# Patient Record
Sex: Male | Born: 1940 | Race: White | Hispanic: No | Marital: Married | Smoking: Former smoker
Health system: Southern US, Community
[De-identification: ages and names within clinical notes are randomized; demographics above are authoritative.]

## PROBLEM LIST (undated history)

## (undated) DIAGNOSIS — N400 Enlarged prostate without lower urinary tract symptoms: Secondary | ICD-10-CM

## (undated) DIAGNOSIS — R519 Headache, unspecified: Secondary | ICD-10-CM

## (undated) DIAGNOSIS — J449 Chronic obstructive pulmonary disease, unspecified: Secondary | ICD-10-CM

## (undated) DIAGNOSIS — K219 Gastro-esophageal reflux disease without esophagitis: Secondary | ICD-10-CM

## (undated) DIAGNOSIS — D126 Benign neoplasm of colon, unspecified: Secondary | ICD-10-CM

## (undated) DIAGNOSIS — K227 Barrett'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 dysplasia: Secondary | ICD-10-CM

## (undated) DIAGNOSIS — E538 Deficiency of other specified B group vitamins: Secondary | ICD-10-CM

## (undated) DIAGNOSIS — C801 Malignant (primary) neoplasm, unspecified: Secondary | ICD-10-CM

## (undated) DIAGNOSIS — E785 Hyperlipidemia, unspecified: Secondary | ICD-10-CM

## (undated) HISTORY — PX: BACK SURGERY: SHX140

## (undated) HISTORY — PX: WRIST SURGERY: SHX841

## (undated) HISTORY — PX: MOHS SURGERY: SUR867

## (undated) HISTORY — PX: OTHER SURGICAL HISTORY: SHX169

## (undated) HISTORY — PX: VASECTOMY: SHX75

---

## 1969-08-31 HISTORY — PX: VASECTOMY: SHX75

## 1980-08-31 HISTORY — PX: BACK SURGERY: SHX140

## 1997-08-31 HISTORY — PX: PROSTATE BIOPSY: SHX241

## 2005-05-15 ENCOUNTER — Other Ambulatory Visit: Payer: Self-pay

## 2005-05-15 ENCOUNTER — Inpatient Hospital Stay: Payer: Self-pay | Admitting: Internal Medicine

## 2006-02-13 IMAGING — CR DG CHEST 1V PORT
1 series · 1 of 1 positions shown · non-contrast
Comparison: none

REASON FOR EXAM: Weakness
COMMENTS:

PROCEDURE:     DXR - DXR PORTABLE CHEST SINGLE VIEW  - May 15, 2005  [DATE]
RESULT:     Portable AP view of the chest shows the lung fields to be clear
of infiltrate.  No pneumonia, pneumothorax, or pleural effusion is seen.
The chest appears hyperexpanded suspicious for history of asthma or COPD.

[view not recorded]
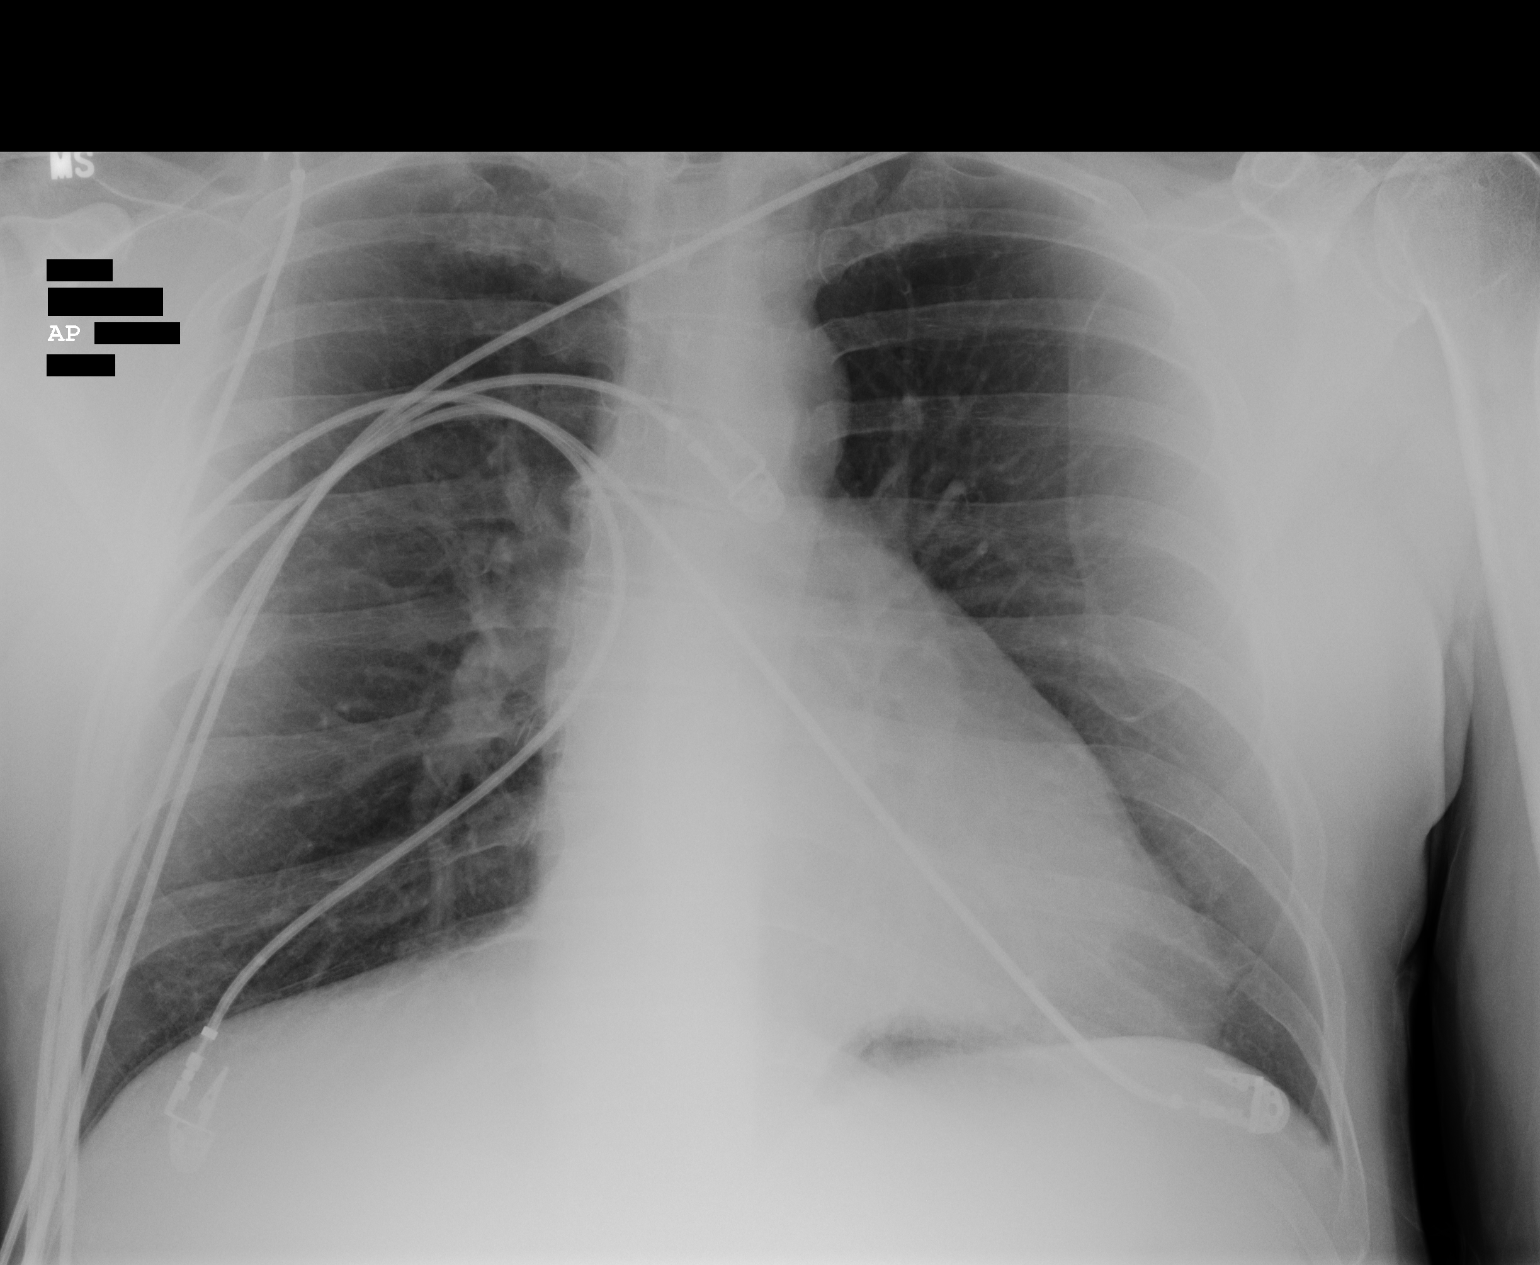

[1 of 1 positions shown; findings below may reference images not displayed]

IMPRESSION: The lung fields are clear.

The chest appears hyperexpanded.

Heart size is normal.

## 2006-07-16 ENCOUNTER — Ambulatory Visit: Payer: Self-pay | Admitting: Unknown Physician Specialty

## 2012-03-15 ENCOUNTER — Ambulatory Visit: Payer: Self-pay | Admitting: Unknown Physician Specialty

## 2012-03-15 HISTORY — PX: COLONOSCOPY: SHX174

## 2012-03-17 LAB — PATHOLOGY REPORT

## 2016-08-02 ENCOUNTER — Encounter: Payer: Self-pay | Admitting: Emergency Medicine

## 2016-08-02 DIAGNOSIS — R040 Epistaxis: Secondary | ICD-10-CM

## 2016-08-02 DIAGNOSIS — Z87891 Personal history of nicotine dependence: Secondary | ICD-10-CM | POA: Diagnosis not present

## 2016-08-02 DIAGNOSIS — Z79899 Other long term (current) drug therapy: Secondary | ICD-10-CM | POA: Insufficient documentation

## 2016-08-02 NOTE — ED Notes (Signed)
Clip placed on pt's nose upon arrival

## 2016-08-02 NOTE — ED Triage Notes (Signed)
Pt states that this AM he had a bloody nose for about a half an hour. Pt states that nose started bleeding again around 2100 this evening and it has not stopped. Pt has nasal clip in place and some bleeding still taking place. Pt is ambulatory to triage with NAD noted at this time in triage.

## 2016-08-03 ENCOUNTER — Emergency Department
Admission: EM | Admit: 2016-08-03 | Discharge: 2016-08-03 | Disposition: A | Discharge status: Home / Self Care | Payer: Medicare Other | Attending: Emergency Medicine | Admitting: Emergency Medicine

## 2016-08-03 ENCOUNTER — Emergency Department
Admission: EM | Admit: 2016-08-03 | Discharge: 2016-08-03 | Disposition: A | Discharge status: Home / Self Care | Payer: Medicare Other | Source: Home / Self Care | Attending: Emergency Medicine | Admitting: Emergency Medicine

## 2016-08-03 DIAGNOSIS — R040 Epistaxis: Secondary | ICD-10-CM | POA: Insufficient documentation

## 2016-08-03 DIAGNOSIS — Z79899 Other long term (current) drug therapy: Secondary | ICD-10-CM | POA: Insufficient documentation

## 2016-08-03 DIAGNOSIS — Z87891 Personal history of nicotine dependence: Secondary | ICD-10-CM | POA: Insufficient documentation

## 2016-08-03 HISTORY — DX: Gastro-esophageal reflux disease without esophagitis: K21.9

## 2016-08-03 HISTORY — DX: Hyperlipidemia, unspecified: E78.5

## 2016-08-03 LAB — CBC WITH DIFFERENTIAL/PLATELET
BASOS ABS: 0.1 10*3/uL (ref 0–0.1)
BASOS PCT: 1 %
Eosinophils Absolute: 0.4 10*3/uL (ref 0–0.7)
Eosinophils Relative: 4 %
HCT: 43.2 % (ref 40.0–52.0)
HEMOGLOBIN: 14.7 g/dL (ref 13.0–18.0)
Lymphocytes Relative: 17 %
Lymphs Abs: 1.5 10*3/uL (ref 1.0–3.6)
MCH: 30.3 pg (ref 26.0–34.0)
MCHC: 34.1 g/dL (ref 32.0–36.0)
MCV: 89 fL (ref 80.0–100.0)
MONO ABS: 0.8 10*3/uL (ref 0.2–1.0)
Monocytes Relative: 9 %
NEUTROS ABS: 6 10*3/uL (ref 1.4–6.5)
NEUTROS PCT: 69 %
Platelets: 225 10*3/uL (ref 150–440)
RBC: 4.85 MIL/uL (ref 4.40–5.90)
RDW: 13.3 % (ref 11.5–14.5)
WBC: 8.7 10*3/uL (ref 3.8–10.6)

## 2016-08-03 LAB — BASIC METABOLIC PANEL
Anion gap: 9 (ref 5–15)
BUN: 21 mg/dL — ABNORMAL HIGH (ref 6–20)
CALCIUM: 9 mg/dL (ref 8.9–10.3)
CHLORIDE: 106 mmol/L (ref 101–111)
CO2: 21 mmol/L — AB (ref 22–32)
Creatinine, Ser: 0.94 mg/dL (ref 0.61–1.24)
GFR calc non Af Amer: >60 mL/min (ref >60)
GLUCOSE: 151 mg/dL — AB (ref 65–99)
POTASSIUM: 3.8 mmol/L (ref 3.5–5.1)
Sodium: 136 mmol/L (ref 135–145)

## 2016-08-03 MED ORDER — OXYCODONE-ACETAMINOPHEN 5-325 MG PO TABS
2.0000 | ORAL_TABLET | Freq: Once | ORAL | Status: AC
Start: 2016-08-03 — End: 2016-08-03
  Administered 2016-08-03: 1 via ORAL
  Filled 2016-08-03: qty 2

## 2016-08-03 MED ORDER — SILVER NITRATE-POT NITRATE 75-25 % EX MISC
CUTANEOUS | Status: AC
  Administered 2016-08-03: 03:00:00
  Filled 2016-08-03: qty 1

## 2016-08-03 MED ORDER — CEPHALEXIN 500 MG PO CAPS: 500.0000 mg | ORAL_CAPSULE | Freq: Four times a day (QID) | ORAL | 0 refills | Status: AC

## 2016-08-03 MED ORDER — CEPHALEXIN 500 MG PO CAPS
500.0000 mg | ORAL_CAPSULE | Freq: Once | ORAL | Status: AC
  Administered 2016-08-03: 500 mg via ORAL
  Filled 2016-08-03: qty 1

## 2016-08-03 MED ORDER — OXYMETAZOLINE HCL 0.05 % NA SOLN
1.0000 | Freq: Once | NASAL | Status: DC
  Filled 2016-08-03: qty 15

## 2016-08-03 NOTE — ED Notes (Signed)
Per Dr Beather Arbour, have Billy Moss place bag of ice on bridge of the nose for 10 minutes and leave off for 10-15 minutes to assist in reducing bleeding.  Billy Moss advised and given ice pack.

## 2016-08-03 NOTE — ED Provider Notes (Signed)
[facility] Emergency Department Provider Note        Time seen: ----------------------------------------- 7:23 AM on 08/03/2016 -----------------------------------------    I have reviewed the triage vital signs and the nursing notes.   HISTORY  Chief Complaint Epistaxis    HPI Billy Moss is a 75 y.o. male who presents to the ER for epistaxis.Patient was in the ER earlier this evening for epistaxis and had cauterization on the right side. Apparently hemostasis was obtained but then subsequently bleeding returned. He states bleeding is coming from the right side is also swallowing blood. Patient states symptoms recurred today after brushing his teeth.  Past Medical History:  Diagnosis Date  . GERD (gastroesophageal reflux disease)   . Hyperlipidemia     There are no active problems to display for this patient.   Past Surgical History:  Procedure Laterality Date  . lamenectomy    . WRIST SURGERY      Allergies Sulfa antibiotics  Social History Social History  Substance Use Topics  . Smoking status: Former Research scientist (life sciences)  . Smokeless tobacco: Never Used  . Alcohol use Yes    Review of Systems Constitutional: Negative for fever. ENT: Positive for epistaxis Cardiovascular: Negative for chest pain. Respiratory: Negative for shortness of breath. Gastrointestinal: Negative for abdominal pain, vomiting and diarrhea. Skin: Negative for rash. Neurological: Negative for headaches, focal weakness or numbness.  10-point ROS otherwise negative.  ____________________________________________   PHYSICAL EXAM:  VITAL SIGNS: ED Triage Vitals  Enc Vitals Group     BP 08/03/16 0524 (!) 166/87     Pulse Rate 08/03/16 0522 81     Resp 08/03/16 0522 20     Temp 08/03/16 0522 97.7 F (36.5 C)     Temp src --      SpO2 08/03/16 0522 95 %     Weight 08/03/16 0524 210 lb (95.3 kg)     Height 08/03/16 0524 6' (1.829 m)     Head Circumference --       Peak Flow --      Pain Score --      Pain Loc --      Pain Edu? --      Excl. in [place]? --     Constitutional: Alert and oriented. Well appearing and in no distress. Eyes: Conjunctivae are normal. PERRL. Normal extraocular movements. ENT   Head: Normocephalic and atraumatic.   Nose: Active bleeding noted to the right, loud noted in both nasal passages.   Mouth/Throat: Mucous membranes are moist. Blood noted in the posterior pharynx   Neck: No stridor. Cardiovascular: Normal rate, regular rhythm. No murmurs, rubs, or gallops. Respiratory: Normal respiratory effort without tachypnea nor retractions. Breath sounds are clear and equal bilaterally. No wheezes/rales/rhonchi. Musculoskeletal: Nontender with normal range of motion in all extremities. No lower extremity tenderness nor edema. Neurologic:  Normal speech and language. No gross focal neurologic deficits are appreciated.  Skin:  Skin is warm, dry and intact. No rash noted. Psychiatric: Mood and affect are normal. Speech and behavior are normal.  ____________________________________________  ED COURSE:  Pertinent labs & imaging results that were available during my care of the patient were reviewed by me and considered in my medical decision making (see chart for details). Clinical Course   Patient presents to ER with recurrent epistaxis. We will provide nasal packing.  Marland KitchenEpistaxis Management Date/Time: 08/03/2016 7:54 AM Performed by: Earleen Newport Authorized by: Lenise Arena E   Consent:    Consent obtained:  Verbal   Consent given by:  Patient Anesthesia (see MAR for exact dosages):    Anesthesia method:  None Procedure details:    Treatment site:  R anterior   Treatment method:  Nasal balloon   Treatment complexity:  Limited   Treatment episode: recurring   Post-procedure details:    Assessment:  Bleeding stopped   Patient tolerance of procedure:  Tolerated well, no immediate  complications    ____________________________________________  FINAL ASSESSMENT AND PLAN  Epistaxis  Plan: Patient with labs and imaging as dictated above. I have observed the patient in the ER for approximately 2 hours without any further bleeding. He is advised he will keep the packing in for 5 days, he'll be given pain medicine as well as Keflex. He is stable for outpatient follow-up.   Earleen Newport, MD   Note: This dictation was prepared with Dragon dictation. Any transcriptional errors that result from this process are unintentional    Earleen Newport, MD 08/03/16 [phone]

## 2016-08-03 NOTE — ED Triage Notes (Signed)
Pt present back to ER after recent discharge for same reason.  Pt reports going home and brushing teeth.  Upon brushing teeth, pt's nose bleed started back.  Pt spitting up blood and back in ER with nose clamp in place.  Pt denies pain at this time.

## 2016-08-03 NOTE — Discharge Instructions (Signed)
1. Your right nostril was cauterized with a silver nitrate stick. 2. If your nose bleeds again, placed nasal clamp and ice to the bridge of your nose 20 minutes. Lower your head to prevent swallowing blood. If the nose continues to bleed, return to the ER. 3. Return to the ER for recurrent or worsening symptoms, persistent vomiting, difficult breathing, feeling faint or other concerns.

## 2016-08-03 NOTE — ED Provider Notes (Signed)
[facility] Emergency Department Provider Note   ____________________________________________   First MD Initiated Contact with Patient 08/03/16 [phone]     (approximate)  I have reviewed the triage vital signs and the nursing notes.   HISTORY  Chief Complaint Epistaxis and Nasal Congestion    HPI Billy Moss is a 75 y.o. male who presents to the ED from home with a chief complaint of nosebleed. Patient reports having a 30 minute nosebleed yesterday morning which resolved spontaneously. Reports his nose started bleeding again approximately 9 PM. States bleeding is from right nostril. Reports he has been having sinus congestion and pressure for the past several days. Denies associated fever, chills, chest pain, shortness breath, abdominal pain, nausea, vomiting, diarrhea. Denies feeling faint. Denies recent travel or trauma. Nothing makes his symptoms better or worse.   Past Medical History:  Diagnosis Date  . GERD (gastroesophageal reflux disease)   . Hyperlipidemia     There are no active problems to display for this patient.   Past Surgical History:  Procedure Laterality Date  . lamenectomy    . WRIST SURGERY      Prior to Admission medications   Medication Sig Start Date End Date Taking? Authorizing Provider  dutasteride (AVODART) 0.5 MG capsule Take 0.5 mg by mouth daily.   Yes Historical Provider, MD  pantoprazole (PROTONIX) 40 MG tablet Take 40 mg by mouth daily.   Yes Historical Provider, MD  simvastatin (ZOCOR) 20 MG tablet Take 20 mg by mouth daily.   Yes Historical Provider, MD    Allergies Sulfa antibiotics  No family history on file.  Social History Social History  Substance Use Topics  . Smoking status: Former Research scientist (life sciences)  . Smokeless tobacco: Never Used  . Alcohol use Yes    Review of Systems  Constitutional: No fever/chills. Eyes: No visual changes. ENT: Positive for right-sided nosebleed. No sore  throat. Cardiovascular: Denies chest pain. Respiratory: Denies shortness of breath. Gastrointestinal: No abdominal pain.  No nausea, no vomiting.  No diarrhea.  No constipation. Genitourinary: Negative for dysuria. Musculoskeletal: Negative for back pain. Skin: Negative for rash. Neurological: Negative for headaches, focal weakness or numbness.  10-point ROS otherwise negative.  ____________________________________________   PHYSICAL EXAM:  VITAL SIGNS: ED Triage Vitals  Enc Vitals Group     BP 08/02/16 2322 (!) 169/87     Pulse Rate 08/02/16 2322 91     Resp 08/02/16 2322 20     Temp 08/02/16 2322 97.7 F (36.5 C)     Temp Source 08/02/16 2322 Oral     SpO2 08/02/16 2322 95 %     Weight 08/02/16 2324 210 lb (95.3 kg)     Height 08/02/16 2324 6' (1.829 m)     Head Circumference --      Peak Flow --      Pain Score 08/02/16 2324 0     Pain Loc --      Pain Edu? --      Excl. in [place]? --     Constitutional: Alert and oriented. Well appearing and in no acute distress. Eyes: Conjunctivae are normal. PERRL. EOMI. Head: Atraumatic. Nose: Nasal clamp in place. Removed to examine nares. Left nares within normal limits. Right nares with area which is raw and irritated along the nasal septum. No active bleeding. Mouth/Throat: Mucous membranes are moist.  Oropharynx non-erythematous. Neck: No stridor.   Cardiovascular: Normal rate, regular rhythm. Grossly normal heart sounds.  Good peripheral circulation. Respiratory: Normal  respiratory effort.  No retractions. Lungs CTAB. Gastrointestinal: Soft and nontender. No distention. No abdominal bruits. No CVA tenderness. Musculoskeletal: No lower extremity tenderness nor edema.  No joint effusions. Neurologic:  Normal speech and language. No gross focal neurologic deficits are appreciated. No gait instability. Skin:  Skin is warm, dry and intact. No rash noted. Psychiatric: Mood and affect are normal. Speech and behavior are  normal.  ____________________________________________   LABS (all labs ordered are listed, but only abnormal results are displayed)  Labs Reviewed  BASIC METABOLIC PANEL - Abnormal; Notable for the following:       Result Value   CO2 21 (*)    Glucose, Bld 151 (*)    BUN 21 (*)    All other components within normal limits  CBC WITH DIFFERENTIAL/PLATELET   ____________________________________________  EKG  None ____________________________________________  RADIOLOGY  None ____________________________________________   PROCEDURES  Procedure(s) performed: None  Procedures  Critical Care performed: No  ____________________________________________   INITIAL IMPRESSION / ASSESSMENT AND PLAN / ED COURSE  Pertinent labs & imaging results that were available during my care of the patient were reviewed by me and considered in my medical decision making (see chart for details).  75 year old male who presents for right-sided epistaxis. Nose is currently not bleeding. Discussed with patient and spouse; will apply silver nitrate cautery stick to right nares to hopefully prevent rebleeding.  Clinical Course as of Aug 03 400  Mon Aug 03, 2016  0254 Right nares cauterized with silver nitrate stick. Will observe for 20 minutes to check for rebleeding.  [JS]  [id number] Reexamined right nares. No active bleeding after silver nitrate cautery. Strict return precautions given. Patient and spouse verbalize understanding and agree with plan of care.  [JS]    Clinical Course User Index [JS] Paulette Blanch, MD     ____________________________________________   FINAL CLINICAL IMPRESSION(S) / ED DIAGNOSES  Final diagnoses:  Epistaxis      NEW MEDICATIONS STARTED DURING THIS VISIT:  New Prescriptions   No medications on file     Note:  This document was prepared using Dragon voice recognition software and may include unintentional dictation errors.    Paulette Blanch,  MD 08/03/16 [phone]

## 2017-06-22 ENCOUNTER — Encounter: Payer: Self-pay | Admitting: *Deleted

## 2017-06-23 ENCOUNTER — Ambulatory Visit: Payer: Medicare Other | Admitting: Anesthesiology

## 2017-06-23 ENCOUNTER — Ambulatory Visit
Admission: RE | Admit: 2017-06-23 | Discharge: 2017-06-23 | Disposition: A | Discharge status: Home / Self Care | Payer: Medicare Other | Source: Ambulatory Visit | Attending: Unknown Physician Specialty | Admitting: Unknown Physician Specialty

## 2017-06-23 ENCOUNTER — Encounter
Admission: RE | Disposition: A | Discharge status: Home / Self Care | Payer: Self-pay | Source: Ambulatory Visit | Attending: Unknown Physician Specialty

## 2017-06-23 ENCOUNTER — Encounter: Payer: Self-pay | Admitting: Anesthesiology

## 2017-06-23 DIAGNOSIS — D123 Benign neoplasm of transverse colon: Secondary | ICD-10-CM | POA: Insufficient documentation

## 2017-06-23 DIAGNOSIS — K210 Gastro-esophageal reflux disease with esophagitis: Secondary | ICD-10-CM | POA: Insufficient documentation

## 2017-06-23 DIAGNOSIS — Z86010 Personal history of colonic polyps: Secondary | ICD-10-CM | POA: Diagnosis not present

## 2017-06-23 DIAGNOSIS — D120 Benign neoplasm of cecum: Secondary | ICD-10-CM | POA: Insufficient documentation

## 2017-06-23 DIAGNOSIS — D122 Benign neoplasm of ascending colon: Secondary | ICD-10-CM | POA: Insufficient documentation

## 2017-06-23 DIAGNOSIS — Q439 Congenital malformation of intestine, unspecified: Secondary | ICD-10-CM | POA: Insufficient documentation

## 2017-06-23 DIAGNOSIS — Z79899 Other long term (current) drug therapy: Secondary | ICD-10-CM | POA: Diagnosis not present

## 2017-06-23 DIAGNOSIS — Z1211 Encounter for screening for malignant neoplasm of colon: Secondary | ICD-10-CM | POA: Diagnosis present

## 2017-06-23 DIAGNOSIS — K227 Barrett'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 dysplasia: Secondary | ICD-10-CM | POA: Diagnosis not present

## 2017-06-23 DIAGNOSIS — Z87891 Personal history of nicotine dependence: Secondary | ICD-10-CM | POA: Insufficient documentation

## 2017-06-23 DIAGNOSIS — K5730 Diverticulosis of large intestine without perforation or abscess without bleeding: Secondary | ICD-10-CM | POA: Diagnosis not present

## 2017-06-23 DIAGNOSIS — K621 Rectal polyp: Secondary | ICD-10-CM | POA: Insufficient documentation

## 2017-06-23 DIAGNOSIS — E785 Hyperlipidemia, unspecified: Secondary | ICD-10-CM | POA: Diagnosis not present

## 2017-06-23 HISTORY — DX: Deficiency of other specified B group vitamins: E53.8

## 2017-06-23 HISTORY — DX: Benign prostatic hyperplasia without lower urinary tract symptoms: N40.0

## 2017-06-23 HISTORY — PX: ESOPHAGOGASTRODUODENOSCOPY (EGD) WITH PROPOFOL: SHX5813

## 2017-06-23 HISTORY — PX: COLONOSCOPY WITH PROPOFOL: SHX5780

## 2017-06-23 HISTORY — DX: Barrett'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 dysplasia: K22.70

## 2017-06-23 HISTORY — DX: Benign neoplasm of colon, unspecified: D12.6

## 2017-06-23 SURGERY — ESOPHAGOGASTRODUODENOSCOPY (EGD) WITH PROPOFOL
Anesthesia: General

## 2017-06-23 MED ORDER — PHENYLEPHRINE HCL 10 MG/ML IJ SOLN
INTRAMUSCULAR | Status: DC | PRN
  Administered 2017-06-23 (×5): 100 ug via INTRAVENOUS

## 2017-06-23 MED ORDER — PROPOFOL 10 MG/ML IV BOLUS
INTRAVENOUS | Status: DC | PRN
  Administered 2017-06-23: 70 mg via INTRAVENOUS
  Administered 2017-06-23: 10 mg via INTRAVENOUS

## 2017-06-23 MED ORDER — PROPOFOL 500 MG/50ML IV EMUL
INTRAVENOUS | Status: DC | PRN
  Administered 2017-06-23: 140 ug/kg/min via INTRAVENOUS

## 2017-06-23 MED ORDER — SODIUM CHLORIDE 0.9 % IV SOLN
INTRAVENOUS | Status: DC
  Administered 2017-06-23: 1000 mL via INTRAVENOUS

## 2017-06-23 MED ORDER — PROPOFOL 10 MG/ML IV BOLUS
INTRAVENOUS | Status: AC
  Filled 2017-06-23: qty 20

## 2017-06-23 MED ORDER — FENTANYL CITRATE (PF) 100 MCG/2ML IJ SOLN
INTRAMUSCULAR | Status: AC
  Filled 2017-06-23: qty 2

## 2017-06-23 MED ORDER — LIDOCAINE HCL (CARDIAC) 20 MG/ML IV SOLN
INTRAVENOUS | Status: DC | PRN
  Administered 2017-06-23: 60 mg via INTRAVENOUS

## 2017-06-23 MED ORDER — FENTANYL CITRATE (PF) 100 MCG/2ML IJ SOLN
INTRAMUSCULAR | Status: DC | PRN
  Administered 2017-06-23: 25 ug via INTRAVENOUS

## 2017-06-23 MED ORDER — GLYCOPYRROLATE 0.2 MG/ML IJ SOLN
INTRAMUSCULAR | Status: DC | PRN
  Administered 2017-06-23: 0.2 mg via INTRAVENOUS

## 2017-06-23 MED ORDER — EPHEDRINE SULFATE 50 MG/ML IJ SOLN
INTRAMUSCULAR | Status: DC | PRN
  Administered 2017-06-23 (×2): 5 mg via INTRAVENOUS

## 2017-06-23 MED ORDER — SODIUM CHLORIDE 0.9 % IV SOLN: INTRAVENOUS | Status: DC

## 2017-06-23 MED ORDER — GLYCOPYRROLATE 0.2 MG/ML IJ SOLN
INTRAMUSCULAR | Status: AC
  Filled 2017-06-23: qty 1

## 2017-06-23 NOTE — Anesthesia Preprocedure Evaluation (Signed)
Anesthesia Evaluation  Patient identified by MRN, date of birth, ID band Patient awake    Reviewed: Allergy & Precautions, NPO status , Patient's Chart, lab work & pertinent test results, reviewed documented beta blocker date and time   Airway Mallampati: II  TM Distance: >3 FB     Dental  (+) Chipped   Pulmonary former smoker,           Cardiovascular      Neuro/Psych    GI/Hepatic   Endo/Other    Renal/GU      Musculoskeletal   Abdominal   Peds  Hematology   Anesthesia Other Findings   Reproductive/Obstetrics                             Anesthesia Physical Anesthesia Plan  ASA: III  Anesthesia Plan: General   Post-op Pain Management:    Induction: Intravenous  PONV Risk Score and Plan:   Airway Management Planned:   Additional Equipment:   Intra-op Plan:   Post-operative Plan:   Informed Consent: I have reviewed the patients History and Physical, chart, labs and discussed the procedure including the risks, benefits and alternatives for the proposed anesthesia with the patient or authorized representative who has indicated his/her understanding and acceptance.     Plan Discussed with: CRNA  Anesthesia Plan Comments:         Anesthesia Quick Evaluation

## 2017-06-23 NOTE — Op Note (Signed)
[facility] Patient Name: Billy Moss Procedure Date: 06/23/2017 8:10 AM MRN: [id number] Account #: [id number] Date of Birth: 1940/11/07 Admit Type: Outpatient Age: 76 Room: [facility] ENDO ROOM 4 Gender: Male Note Status: Finalized Procedure:            Colonoscopy Indications:          High risk colon cancer surveillance: Personal history                        of colonic polyps Providers:            Manya Silvas, MD Referring MD:         Rusty Aus, MD (Referring MD) Medicines:            Propofol per Anesthesia Complications:        No immediate complications. Procedure:            Pre-Anesthesia Assessment:                       - After reviewing the risks and benefits, the patient                        was deemed in satisfactory condition to undergo the                        procedure.                       After obtaining informed consent, the colonoscope was                        passed under direct vision. Throughout the procedure,                        the patient's blood pressure, pulse, and oxygen                        saturations were monitored continuously. The                        Colonoscope was introduced through the anus and                        advanced to the the cecum, identified by appendiceal                        orifice and ileocecal valve. The colonoscopy was                        somewhat difficult due to significant looping and a                        tortuous colon. The patient tolerated the procedure                        well. The quality of the bowel preparation was good. Findings:      A 10 mm polyp was found in the transverse colon. The polyp was sessile.       The polyp was removed with a hot snare. Resection and retrieval were       complete.  A small polyp was found in the ascending colon. The polyp was sessile.       The polyp was removed with a hot snare. Resection and retrieval were        complete.      A small polyp was found in the ascending colon. The polyp was sessile.       The polyp was removed with a hot snare. Resection and retrieval were       complete.      A 20 mm polyp was found in the cecum. The polyp was multi-lobulated. The       polyp was removed with a hot snare. Resection and retrieval were       complete.      A diminutive polyp was found in the hepatic flexure. The polyp was       sessile. The polyp was removed with a jumbo cold forceps. Resection and       retrieval were complete.      A small polyp was found in the sigmoid colon. The polyp was sessile. The       polyp was removed with a hot snare. Resection and retrieval were       complete.      A diminutive polyp was found in the rectum. The polyp was sessile. The       polyp was removed with a hot snare. Resection and retrieval were       complete.      Multiple small and large-mouthed diverticula were found in the       recto-sigmoid colon, sigmoid colon, descending colon, transverse colon,       ascending colon and cecum. Impression:           - One 10 mm polyp in the transverse colon, removed with                        a hot snare. Resected and retrieved.                       - One small polyp in the ascending colon, removed with                        a hot snare. Resected and retrieved.                       - One small polyp in the ascending colon, removed with                        a hot snare. Resected and retrieved.                       - One 20 mm polyp in the cecum, removed with a hot                        snare. Resected and retrieved.                       - One diminutive polyp at the hepatic flexure, removed                        with a jumbo cold forceps. Resected and retrieved.                       -  One small polyp in the sigmoid colon, removed with a                        hot snare. Resected and retrieved.                       - One diminutive polyp in the  rectum, removed with a                        hot snare. Resected and retrieved.                       - Diverticulosis in the recto-sigmoid colon, in the                        sigmoid colon, in the descending colon, in the                        transverse colon, in the ascending colon and in the                        cecum. Recommendation:       - Await pathology results. Manya Silvas, MD 06/23/2017 9:29:38 AM This report has been signed electronically. Number of Addenda: 0 Note Initiated On: 06/23/2017 8:10 AM Scope Withdrawal Time: 0 hours 34 minutes 48 seconds  Total Procedure Duration: 0 hours 49 minutes 2 seconds       [facility]

## 2017-06-23 NOTE — Transfer of Care (Signed)
Immediate Anesthesia Transfer of Care Note  Patient: Billy Moss  Procedure(s) Performed: ESOPHAGOGASTRODUODENOSCOPY (EGD) WITH PROPOFOL (N/A ) COLONOSCOPY WITH PROPOFOL (N/A )  Patient Location: PACU  Anesthesia Type:General  Level of Consciousness: awake, alert  and oriented  Airway & Oxygen Therapy: Patient Spontanous Breathing and Patient connected to nasal cannula oxygen  Post-op Assessment: Report given to RN and Post -op Vital signs reviewed and stable  Post vital signs: Reviewed and stable  Last Vitals:  Vitals:   06/23/17 0741 06/23/17 0925  BP: 135/72 122/65  Pulse: 72 87  Resp: 16 20  Temp: (!) 36.1 C (!) 36.1 C  SpO2: 99%     Last Pain:  Vitals:   06/23/17 0741  TempSrc: Tympanic         Complications: No apparent anesthesia complications

## 2017-06-23 NOTE — H&P (Signed)
   Primary Care Physician:  Rusty Aus, MD Primary Gastroenterologist:  Dr. Vira Agar  Pre-Procedure History & Physical: HPI:  Billy Moss is a 76 y.o. male is here for an endoscopy and colonoscopy.   Past Medical History:  Diagnosis Date  . Barrett'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 dysplasia   . Benign tubular adenoma of large intestine   . BPH (benign prostatic hyperplasia)   . GERD (gastroesophageal reflux disease)   . Hyperlipidemia   . Vitamin B 12 deficiency     Past Surgical History:  Procedure Laterality Date  . BACK SURGERY     LAMINECTOMY LUMBAR SPINE  . COLONOSCOPY  03/15/2012  . lamenectomy    . PROSTATE BIOPSY  1999  . VASECTOMY    . WRIST SURGERY      Prior to Admission medications   Medication Sig Start Date End Date Taking? Authorizing Provider  dutasteride (AVODART) 0.5 MG capsule Take 0.5 mg by mouth daily.   Yes [provider]  ibuprofen (ADVIL,MOTRIN) 200 MG tablet Take 200 mg by mouth every 6 (six) hours as needed.   Yes [provider]  pantoprazole (PROTONIX) 40 MG tablet Take 40 mg by mouth daily.   Yes [provider]  simvastatin (ZOCOR) 20 MG tablet Take 20 mg by mouth daily.   Yes [provider]    Allergies as of 04/02/2017 - Review Complete 08/03/2016  Allergen Reaction Noted  . Sulfa antibiotics Rash 08/02/2016    History reviewed. No pertinent family history.  Social History   Social History  . Marital status: Married    Spouse name: N/A  . Number of children: N/A  . Years of education: N/A   Occupational History  . Not on file.   Social History Main Topics  . Smoking status: Former Smoker    Types: Cigarettes    Quit date: 01/13/1984  . Smokeless tobacco: Never Used  . Alcohol use 3.6 oz/week    6 Shots of liquor per week  . Drug use: No  . Sexual activity: Not on file   Other Topics Concern  . Not on file   Social History Narrative  . No narrative on file    Review of Systems: See  HPI, otherwise negative ROS  Physical Exam: BP 135/72   Pulse 72   Temp (!) 97 F (36.1 C) (Tympanic)   Resp 16   Ht 6' (1.829 m)   Wt 90 kg (198 lb 8 oz)   SpO2 99%   BMI 26.92 kg/m  General:   Alert,  pleasant and cooperative in NAD Head:  Normocephalic and atraumatic. Neck:  Supple; no masses or thyromegaly. Lungs:  Clear throughout to auscultation.    Heart:  Regular rate and rhythm. Abdomen:  Soft, nontender and nondistended. Normal bowel sounds, without guarding, and without rebound.   Neurologic:  Alert and  oriented x4;  grossly normal neurologically.  Impression/Plan: Windy Fast is here for an endoscopy and colonoscopy to be performed for [facility] colon polyps and Barretts esophagus.  Risks, benefits, limitations, and alternatives regarding  endoscopy and colonoscopy have been reviewed with the patient.  Questions have been answered.  All parties agreeable.   Gaylyn Cheers, MD  06/23/2017, 8:15 AM

## 2017-06-23 NOTE — Anesthesia Post-op Follow-up Note (Signed)
Anesthesia QCDR form completed.        

## 2017-06-23 NOTE — Anesthesia Postprocedure Evaluation (Signed)
Anesthesia Post Note  Patient: Billy Moss  Procedure(s) Performed: ESOPHAGOGASTRODUODENOSCOPY (EGD) WITH PROPOFOL (N/A ) COLONOSCOPY WITH PROPOFOL (N/A )  Patient location during evaluation: Endoscopy Anesthesia Type: General Level of consciousness: awake and alert Pain management: pain level controlled Vital Signs Assessment: post-procedure vital signs reviewed and stable Respiratory status: spontaneous breathing, nonlabored ventilation, respiratory function stable and patient connected to nasal cannula oxygen Cardiovascular status: blood pressure returned to baseline and stable Postop Assessment: no apparent nausea or vomiting Anesthetic complications: no     Last Vitals:  Vitals:   06/23/17 0925 06/23/17 0945  BP: 122/65 107/71  Pulse: 87   Resp: 20   Temp: (!) 36.1 C   SpO2:      Last Pain:  Vitals:   06/23/17 0741  TempSrc: Tympanic                 Eudora Guevarra S

## 2017-06-23 NOTE — Op Note (Signed)
[facility] Gastroenterology Patient Name: Billy Moss Procedure Date: 06/23/2017 8:09 AM MRN: [id number] Account #: [id number] Date of Birth: July 03, 1941 Admit Type: Outpatient Age: 76 Room: [facility] ENDO ROOM 4 Gender: Male Note Status: Finalized Procedure:            Upper GI endoscopy Indications:          Follow-up of Barrett's esophagus Providers:            Manya Silvas, MD Referring MD:         Rusty Aus, MD (Referring MD) Medicines:            Propofol per Anesthesia Complications:        No immediate complications. Procedure:            Pre-Anesthesia Assessment:                       - After reviewing the risks and benefits, the patient                        was deemed in satisfactory condition to undergo the                        procedure.                       After obtaining informed consent, the endoscope was                        passed under direct vision. Throughout the procedure,                        the patient's blood pressure, pulse, and oxygen                        saturations were monitored continuously. The Endoscope                        was introduced through the mouth, and advanced to the                        second part of duodenum. The upper GI endoscopy was                        accomplished without difficulty. The patient tolerated                        the procedure well. Findings:      There were esophageal mucosal changes secondary to established       short-segment Barrett's disease present in the distal esophagus. The       maximum longitudinal extent of these mucosal changes was 2 cm in length.      The stomach was normal.      The examined duodenum was normal. Impression:           - Esophageal mucosal changes secondary to established                        short-segment Barrett's disease.                       - Normal stomach.                       -  Normal examined duodenum.                       -  No specimens collected. Recommendation:       - Await pathology results. Continue current medication. Manya Silvas, MD 06/23/2017 8:29:44 AM This report has been signed electronically. Number of Addenda: 0 Note Initiated On: 06/23/2017 8:09 AM      [facility]

## 2017-06-24 LAB — SURGICAL PATHOLOGY

## 2017-06-25 ENCOUNTER — Encounter: Payer: Self-pay | Admitting: Unknown Physician Specialty

## 2018-02-03 ENCOUNTER — Emergency Department: Payer: Medicare Other

## 2018-02-03 ENCOUNTER — Observation Stay
Admission: EM | Admit: 2018-02-03 | Discharge: 2018-02-04 | Disposition: A | Discharge status: Home / Self Care | Payer: Medicare Other | Attending: Specialist | Admitting: Specialist

## 2018-02-03 ENCOUNTER — Observation Stay
Admit: 2018-02-03 | Discharge: 2018-02-03 | Disposition: A | Discharge status: Home / Self Care | Payer: Medicare Other | Attending: Internal Medicine | Admitting: Internal Medicine

## 2018-02-03 ENCOUNTER — Other Ambulatory Visit: Payer: Self-pay

## 2018-02-03 ENCOUNTER — Encounter: Payer: Self-pay | Admitting: Emergency Medicine

## 2018-02-03 DIAGNOSIS — K219 Gastro-esophageal reflux disease without esophagitis: Secondary | ICD-10-CM | POA: Diagnosis not present

## 2018-02-03 DIAGNOSIS — R55 Syncope and collapse: Principal | ICD-10-CM | POA: Insufficient documentation

## 2018-02-03 DIAGNOSIS — Z87891 Personal history of nicotine dependence: Secondary | ICD-10-CM | POA: Insufficient documentation

## 2018-02-03 DIAGNOSIS — E785 Hyperlipidemia, unspecified: Secondary | ICD-10-CM | POA: Insufficient documentation

## 2018-02-03 DIAGNOSIS — E538 Deficiency of other specified B group vitamins: Secondary | ICD-10-CM | POA: Diagnosis not present

## 2018-02-03 DIAGNOSIS — Z882 Allergy status to sulfonamides status: Secondary | ICD-10-CM | POA: Insufficient documentation

## 2018-02-03 DIAGNOSIS — N400 Enlarged prostate without lower urinary tract symptoms: Secondary | ICD-10-CM | POA: Insufficient documentation

## 2018-02-03 DIAGNOSIS — Z7951 Long term (current) use of inhaled steroids: Secondary | ICD-10-CM | POA: Insufficient documentation

## 2018-02-03 DIAGNOSIS — R531 Weakness: Secondary | ICD-10-CM

## 2018-02-03 DIAGNOSIS — R001 Bradycardia, unspecified: Secondary | ICD-10-CM | POA: Diagnosis present

## 2018-02-03 DIAGNOSIS — Z79899 Other long term (current) drug therapy: Secondary | ICD-10-CM | POA: Insufficient documentation

## 2018-02-03 LAB — CBC WITH DIFFERENTIAL/PLATELET
BASOS ABS: 0.1 10*3/uL (ref 0–0.1)
Basophils Relative: 1 %
Eosinophils Absolute: 0.2 10*3/uL (ref 0–0.7)
Eosinophils Relative: 2 %
HEMATOCRIT: 41.8 % (ref 40.0–52.0)
HEMOGLOBIN: 14.3 g/dL (ref 13.0–18.0)
LYMPHS PCT: 13 %
Lymphs Abs: 1.1 10*3/uL (ref 1.0–3.6)
MCH: 30.9 pg (ref 26.0–34.0)
MCHC: 34.3 g/dL (ref 32.0–36.0)
MCV: 90.1 fL (ref 80.0–100.0)
MONO ABS: 0.6 10*3/uL (ref 0.2–1.0)
Monocytes Relative: 7 %
NEUTROS ABS: 6.8 10*3/uL — AB (ref 1.4–6.5)
NEUTROS PCT: 77 %
Platelets: 141 10*3/uL — ABNORMAL LOW (ref 150–440)
RBC: 4.63 MIL/uL (ref 4.40–5.90)
RDW: 13.7 % (ref 11.5–14.5)
WBC: 8.8 10*3/uL (ref 3.8–10.6)

## 2018-02-03 LAB — TROPONIN I

## 2018-02-03 LAB — COMPREHENSIVE METABOLIC PANEL
ALBUMIN: 3.8 g/dL (ref 3.5–5.0)
ALK PHOS: 54 U/L (ref 38–126)
ALT: 14 U/L — AB (ref 17–63)
AST: 16 U/L (ref 15–41)
Anion gap: 10 (ref 5–15)
BILIRUBIN TOTAL: 0.7 mg/dL (ref 0.3–1.2)
BUN: 20 mg/dL (ref 6–20)
CO2: 20 mmol/L — ABNORMAL LOW (ref 22–32)
CREATININE: 0.99 mg/dL (ref 0.61–1.24)
Calcium: 8.6 mg/dL — ABNORMAL LOW (ref 8.9–10.3)
Chloride: 109 mmol/L (ref 101–111)
GFR calc Af Amer: >60 mL/min (ref >60)
GLUCOSE: 130 mg/dL — AB (ref 65–99)
POTASSIUM: 3.9 mmol/L (ref 3.5–5.1)
Sodium: 139 mmol/L (ref 135–145)
TOTAL PROTEIN: 6.5 g/dL (ref 6.5–8.1)

## 2018-02-03 LAB — GLUCOSE, CAPILLARY
Glucose-Capillary: 109 mg/dL — ABNORMAL HIGH (ref 65–99)
Glucose-Capillary: 110 mg/dL — ABNORMAL HIGH (ref 65–99)

## 2018-02-03 LAB — MAGNESIUM: MAGNESIUM: 2.1 mg/dL (ref 1.7–2.4)

## 2018-02-03 LAB — PHOSPHORUS: Phosphorus: 3.6 mg/dL (ref 2.5–4.6)

## 2018-02-03 LAB — ECHOCARDIOGRAM COMPLETE
Height: 71 in
Weight: 3296 oz

## 2018-02-03 MED ORDER — ENOXAPARIN SODIUM 40 MG/0.4ML ~~LOC~~ SOLN
40.0000 mg | SUBCUTANEOUS | Status: DC
  Administered 2018-02-03 – 2018-02-04 (×2): 40 mg via SUBCUTANEOUS
  Filled 2018-02-03 (×3): qty 0.4

## 2018-02-03 MED ORDER — BISACODYL 5 MG PO TBEC: 5.0000 mg | DELAYED_RELEASE_TABLET | Freq: Every day | ORAL | Status: DC | PRN

## 2018-02-03 MED ORDER — SODIUM CHLORIDE 0.9 % IV SOLN
INTRAVENOUS | Status: DC
  Administered 2018-02-03: 05:00:00 via INTRAVENOUS

## 2018-02-03 MED ORDER — PANTOPRAZOLE SODIUM 40 MG PO TBEC
40.0000 mg | DELAYED_RELEASE_TABLET | Freq: Every day | ORAL | Status: DC
  Administered 2018-02-03 – 2018-02-04 (×2): 40 mg via ORAL
  Filled 2018-02-03 (×2): qty 1

## 2018-02-03 MED ORDER — SIMVASTATIN 10 MG PO TABS
20.0000 mg | ORAL_TABLET | Freq: Every day | ORAL | Status: DC
  Administered 2018-02-03: 20 mg via ORAL
  Filled 2018-02-03 (×2): qty 2

## 2018-02-03 MED ORDER — SODIUM CHLORIDE 0.9% FLUSH
3.0000 mL | Freq: Two times a day (BID) | INTRAVENOUS | Status: DC
  Administered 2018-02-03 – 2018-02-04 (×2): 3 mL via INTRAVENOUS

## 2018-02-03 MED ORDER — DUTASTERIDE 0.5 MG PO CAPS
0.5000 mg | ORAL_CAPSULE | Freq: Every day | ORAL | Status: DC
  Administered 2018-02-03 – 2018-02-04 (×2): 0.5 mg via ORAL
  Filled 2018-02-03 (×2): qty 1

## 2018-02-03 MED ORDER — ACETAMINOPHEN 325 MG PO TABS: 650.0000 mg | ORAL_TABLET | Freq: Four times a day (QID) | ORAL | Status: DC | PRN

## 2018-02-03 MED ORDER — SODIUM CHLORIDE 0.9 % IV BOLUS
1000.0000 mL | Freq: Once | INTRAVENOUS | Status: AC
  Administered 2018-02-03: 1000 mL via INTRAVENOUS

## 2018-02-03 MED ORDER — ASPIRIN EC 81 MG PO TBEC
81.0000 mg | DELAYED_RELEASE_TABLET | Freq: Every day | ORAL | Status: DC
  Administered 2018-02-03 – 2018-02-04 (×2): 81 mg via ORAL
  Filled 2018-02-03 (×2): qty 1

## 2018-02-03 MED ORDER — SENNOSIDES-DOCUSATE SODIUM 8.6-50 MG PO TABS: 1.0000 | ORAL_TABLET | Freq: Every evening | ORAL | Status: DC | PRN

## 2018-02-03 MED ORDER — ACETAMINOPHEN 650 MG RE SUPP: 650.0000 mg | Freq: Four times a day (QID) | RECTAL | Status: DC | PRN

## 2018-02-03 MED ORDER — ONDANSETRON HCL 4 MG PO TABS: 4.0000 mg | ORAL_TABLET | Freq: Four times a day (QID) | ORAL | Status: DC | PRN

## 2018-02-03 MED ORDER — ONDANSETRON HCL 4 MG/2ML IJ SOLN: 4.0000 mg | Freq: Four times a day (QID) | INTRAMUSCULAR | Status: DC | PRN

## 2018-02-03 NOTE — ED Triage Notes (Signed)
Came via [facility] for weakness, bradycardia and N/V starting tonight. Pt denies CP/SOB. CAOx4. Received 1mg  atropine and 4mg  zofran IV by EMS.

## 2018-02-03 NOTE — Progress Notes (Signed)
*  PRELIMINARY RESULTS* Echocardiogram 2D Echocardiogram has been performed.  Billy Moss 02/03/2018, 11:28 AM

## 2018-02-03 NOTE — H&P (Signed)
[place] [facility] [place] NAME: Billy Moss    MR#:  [id number]  DATE OF BIRTH:  1941/08/20  DATE OF ADMISSION:  02/03/2018  PRIMARY CARE PHYSICIAN: Rusty Aus, MD   REQUESTING/REFERRING PHYSICIAN: Paulette Blanch, MD  CHIEF COMPLAINT:   Chief Complaint  Patient presents with  . Fatigue   HISTORY OF PRESENT ILLNESS:  Billy Moss  is a 77 y.o. male with a known history of GERD p/w 1d LH/near-syncope, symptomatic bradycardia. R-sided sinus headache early in week, has been on Allegra D and Nasocort spray since then. Was in bed at 11PM on Tues 06/05, felt clammy and weak all over, lightheaded/near-syncopal, got out of bed and laid down on floor so he wouldn't fall out of bed. Wife checked BP (135/60), gave ASA, called EMS. HR in 41s by EMS. Received atropine. HR in 60s at time of my assessment. Still endorses lightheadedness, feels "out of it". ED EKG sinus bradycardia @ 47bpm. Had a standard physical on Monday. States he had bloodwork ~2wk ago preceding his physical, states he felt unsteady working in the yard later in the day after bloodwork. 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 (-) vertigo, blurred vision, focal neurological changes. No other symptoms.  PAST MEDICAL HISTORY:   Past Medical History:  Diagnosis Date  . Barrett'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 dysplasia   . Benign tubular adenoma of large intestine   . BPH (benign prostatic hyperplasia)   . GERD (gastroesophageal reflux disease)   . Hyperlipidemia   . Vitamin B 12 deficiency     PAST SURGICAL HISTORY:   Past Surgical History:  Procedure Laterality Date  . BACK SURGERY     LAMINECTOMY LUMBAR SPINE  . COLONOSCOPY  03/15/2012  . COLONOSCOPY WITH PROPOFOL N/A 06/23/2017   Procedure: COLONOSCOPY WITH PROPOFOL;  Surgeon: Manya Silvas, MD;  Location: [facility] ENDOSCOPY;  Service: Endoscopy;  Laterality: N/A;  . ESOPHAGOGASTRODUODENOSCOPY  (EGD) WITH PROPOFOL N/A 06/23/2017   Procedure: ESOPHAGOGASTRODUODENOSCOPY (EGD) WITH PROPOFOL;  Surgeon: Manya Silvas, MD;  Location: [facility] ENDOSCOPY;  Service: Endoscopy;  Laterality: N/A;  . lamenectomy    . PROSTATE BIOPSY  1999  . VASECTOMY    . WRIST SURGERY      SOCIAL HISTORY:   Social History   Tobacco Use  . Smoking status: Former Smoker    Types: Cigarettes    Last attempt to quit: 01/13/1984    Years since quitting: 34.0  . Smokeless tobacco: Never Used  Substance Use Topics  . Alcohol use: Yes    Alcohol/week: 3.6 oz    Types: 6 Shots of liquor per week    FAMILY HISTORY:  History reviewed. No pertinent family history.  DRUG ALLERGIES:   Allergies  Allergen Reactions  . Sulfa Antibiotics Rash    REVIEW OF SYSTEMS:   Review of Systems  Constitutional: Positive for malaise/fatigue. Negative for chills, diaphoresis, fever and weight loss.  HENT: Positive for sinus pain. Negative for congestion, ear pain, hearing loss, nosebleeds, sore throat and tinnitus.   Eyes: Negative for blurred vision, double vision and photophobia.  Respiratory: Negative for cough, hemoptysis, sputum production, shortness of breath and wheezing.   Cardiovascular: Negative for chest pain, palpitations, orthopnea, claudication, leg swelling and PND.  Gastrointestinal: Negative for abdominal pain, blood in stool, constipation, diarrhea, heartburn, melena, nausea and vomiting.  Genitourinary: Negative for dysuria, frequency, hematuria and urgency.  Musculoskeletal: Negative for back pain, falls, joint  pain, myalgias and neck pain.  Skin: Negative for itching and rash.  Neurological: Positive for dizziness, weakness and headaches. Negative for tingling, tremors, sensory change, speech change, focal weakness, seizures and loss of consciousness.  Psychiatric/Behavioral: Negative for memory loss. The patient does not have insomnia.    MEDICATIONS AT HOME:   Prior to Admission medications    Medication Sig Start Date End Date Taking? Authorizing Provider  ascorbic acid (VITAMIN C) 250 MG tablet Take 1 tablet by mouth daily.   Yes [provider]  dutasteride (AVODART) 0.5 MG capsule Take 0.5 mg by mouth daily.   Yes [provider]  fexofenadine-pseudoephedrine (ALLEGRA-D) 60-120 MG 12 hr tablet Take 1 tablet by mouth 2 (two) times daily.   Yes [provider]  ibuprofen (ADVIL,MOTRIN) 200 MG tablet Take 200 mg by mouth every 6 (six) hours as needed.   Yes [provider]  pantoprazole (PROTONIX) 40 MG tablet Take 40 mg by mouth daily.   Yes [provider]  simvastatin (ZOCOR) 20 MG tablet Take 20 mg by mouth daily.   Yes [provider]  Triamcinolone Acetonide (NASACORT AQ NA) Place 1 spray into the nose daily.   Yes [provider]  vitamin B-12 (CYANOCOBALAMIN) 1000 MCG tablet Take 1 tablet by mouth daily.   Yes [provider]      VITAL SIGNS:  Blood pressure 124/79, pulse 73, temperature 97.7 F (36.5 C), temperature source Oral, resp. rate 20, height 5\' 11"  (1.803 m), weight 93.4 kg (206 lb), SpO2 97 %.  PHYSICAL EXAMINATION:  Physical Exam  Constitutional: He is oriented to person, place, and time. He appears well-developed and well-nourished. He is active and cooperative.  Non-toxic appearance. He does not have a sickly appearance. He does not appear ill. No distress. He is not intubated.  HENT:  Head: Normocephalic and atraumatic.  Mouth/Throat: Oropharynx is clear and moist. No oropharyngeal exudate.  Eyes: Conjunctivae, EOM and lids are normal. No scleral icterus.  Neck: Neck supple. No JVD present. No thyromegaly present.  Cardiovascular: Normal rate, regular rhythm, S1 normal, S2 normal and normal heart sounds.  No extrasystoles are present. Exam reveals no gallop, no S3, no S4, no distant heart sounds and no friction rub.  No murmur heard. Pulmonary/Chest: Effort normal and breath sounds  normal. No accessory muscle usage or stridor. No apnea, no tachypnea and no bradypnea. He is not intubated. No respiratory distress. He has no decreased breath sounds. He has no wheezes. He has no rhonchi. He has no rales.  Abdominal: Soft. Bowel sounds are normal. He exhibits no distension. There is no tenderness. There is no rebound and no guarding.  Musculoskeletal: Normal range of motion. He exhibits no edema or tenderness.  Lymphadenopathy:    He has no cervical adenopathy.  Neurological: He is alert and oriented to person, place, and time. He is not disoriented.  Skin: Skin is warm, dry and intact. No rash noted. He is not diaphoretic. No erythema.  Psychiatric: He has a normal mood and affect. His speech is normal and behavior is normal. Judgment and thought content normal. Cognition and memory are normal.   LABORATORY PANEL:   CBC Recent Labs  Lab 02/03/18 0148  WBC 8.8  HGB 14.3  HCT 41.8  PLT 141*   ------------------------------------------------------------------------------------------------------------------  Chemistries  Recent Labs  Lab 02/03/18 0148  NA 139  K 3.9  CL 109  CO2 20*  GLUCOSE 130*  BUN 20  CREATININE 0.99  CALCIUM  8.6*  AST 16  ALT 14*  ALKPHOS 54  BILITOT 0.7   ------------------------------------------------------------------------------------------------------------------  Cardiac Enzymes Recent Labs  Lab 02/03/18 0148  TROPONINI <0.03   ------------------------------------------------------------------------------------------------------------------  RADIOLOGY:  Dg Chest Port 1 View  Result Date: 02/03/2018 CLINICAL DATA:  Acute onset of generalized weakness, bradycardia, nausea and vomiting. EXAM: PORTABLE CHEST 1 VIEW COMPARISON:  Chest radiograph performed 05/15/2005 FINDINGS: The lungs are well-aerated and clear. There is no evidence of focal opacification, pleural effusion or pneumothorax. The cardiomediastinal silhouette is  within normal limits. No acute osseous abnormalities are seen. IMPRESSION: No acute cardiopulmonary process seen. Electronically Signed   By: Garald Balding M.D.   On: 02/03/2018 01:51   IMPRESSION AND PLAN:   A/P: 88M LH/near-syncope, symptomatic bradycardia, hyperglycemia. -Tele, cardiac monitoring -Orthostatic VS -FSG qHS/AC -Neuro checks -Fall precautions -IVF -Echo -Trend Trop-I -PR interval 184 -EKG PRN -ASA -c/w statin -Not on beta blocker, calcium channel blocker, clonidine -Not on alpha blocker -Cardiology consult, may need EP eval -Cardiac diet -Lovenox -Full code -Observation, < 2 midnights   All the records are reviewed and case discussed with ED provider. Management plans discussed with the patient, family and they are in agreement.  CODE STATUS: Full code  TOTAL TIME TAKING CARE OF THIS PATIENT: 90 minutes.    Arta Silence M.D on 02/03/2018 at 4:43 AM  Between 7am to 6pm - Pager - [phone]  After 6pm go to [email or web address] - Proofreader  [facility]  Office  [phone]  CC: Primary care physician; Rusty Aus, MD   Note: This dictation was prepared with Dragon dictation along with smaller phrase technology. Any transcriptional errors that result from this process are unintentional.

## 2018-02-03 NOTE — Progress Notes (Signed)
So far monitor has no pauses and severe bradycardia, and no indication for PPM.

## 2018-02-03 NOTE — Consult Note (Signed)
Billy Moss is a 77 y.o. male  [id number]  Primary Cardiologist: Neoma Laming Reason for Consultation: bradycardia and dizziness  HPI: is a 77 year old white male with a past medical history of hypertension BPH hyperlipidemia Barrett's esophagitis presented to the hospital with dizziness and feeling like she is going to pass out this happened a week ago and last night thus he decided to come to the hospital.   Review of Systems: no chest pain or shortness of breath   Past Medical History:  Diagnosis Date  . Barrett'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 dysplasia   . Benign tubular adenoma of large intestine   . BPH (benign prostatic hyperplasia)   . GERD (gastroesophageal reflux disease)   . Hyperlipidemia   . Vitamin B 12 deficiency     Medications Prior to Admission  Medication Sig Dispense Refill  . ascorbic acid (VITAMIN C) 250 MG tablet Take 1 tablet by mouth daily.    Marland Kitchen dutasteride (AVODART) 0.5 MG capsule Take 0.5 mg by mouth daily.    . fexofenadine-pseudoephedrine (ALLEGRA-D) 60-120 MG 12 hr tablet Take 1 tablet by mouth 2 (two) times daily.    Marland Kitchen ibuprofen (ADVIL,MOTRIN) 200 MG tablet Take 200 mg by mouth every 6 (six) hours as needed.    . pantoprazole (PROTONIX) 40 MG tablet Take 40 mg by mouth daily.    . simvastatin (ZOCOR) 20 MG tablet Take 20 mg by mouth daily.    . Triamcinolone Acetonide (NASACORT AQ NA) Place 1 spray into the nose daily.    . vitamin B-12 (CYANOCOBALAMIN) 1000 MCG tablet Take 1 tablet by mouth daily.       Marland Kitchen aspirin EC  81 mg Oral Daily  . dutasteride  0.5 mg Oral Daily  . enoxaparin (LOVENOX) injection  40 mg Subcutaneous Q24H  . pantoprazole  40 mg Oral Daily  . simvastatin  20 mg Oral Daily  . sodium chloride flush  3 mL Intravenous Q12H    Infusions: . sodium chloride 75 mL/hr at 02/03/18 0456    Allergies  Allergen Reactions  . Sulfa Antibiotics Rash    Social History   Socioeconomic History  . Marital status: Married    Spouse  name: Not on file  . Number of children: Not on file  . Years of education: Not on file  . Highest education level: Not on file  Occupational History  . Not on file  Social Needs  . Financial resource strain: Not on file  . Food insecurity:    Worry: Not on file    Inability: Not on file  . Transportation needs:    Medical: Not on file    Non-medical: Not on file  Tobacco Use  . Smoking status: Former Smoker    Types: Cigarettes    Last attempt to quit: 01/13/1984    Years since quitting: 34.0  . Smokeless tobacco: Never Used  Substance and Sexual Activity  . Alcohol use: Yes    Alcohol/week: 3.6 oz    Types: 6 Shots of liquor per week  . Drug use: No  . Sexual activity: Not on file  Lifestyle  . Physical activity:    Days per week: Not on file    Minutes per session: Not on file  . Stress: Not on file  Relationships  . Social connections:    Talks on phone: Not on file    Gets together: Not on file    Attends religious service: Not on file    Active  member of club or organization: Not on file    Attends meetings of clubs or organizations: Not on file    Relationship status: Not on file  . Intimate partner violence:    Fear of current or ex partner: Not on file    Emotionally abused: Not on file    Physically abused: Not on file    Forced sexual activity: Not on file  Other Topics Concern  . Not on file  Social History Narrative  . Not on file    History reviewed. No pertinent family history.  PHYSICAL EXAM: Vitals:   02/03/18 0439 02/03/18 0740  BP: 124/79 (!) 122/91  Pulse: 73 66  Resp:  14  Temp:    SpO2:  96%    No intake or output data in the 24 hours ending 02/03/18 0924  General:  Well appearing. No respiratory difficulty HEENT: normal Neck: supple. no JVD. Carotids 2+ bilat; no bruits. No lymphadenopathy or thryomegaly appreciated. Cor: PMI nondisplaced. Regular rate & rhythm. No rubs, gallops or murmurs. Lungs: clear Abdomen: soft,  nontender, nondistended. No hepatosplenomegaly. No bruits or masses. Good bowel sounds. Extremities: no cyanosis, clubbing, rash, edema Neuro: alert & oriented x 3, cranial nerves grossly intact. moves all 4 extremities w/o difficulty. Affect pleasant.  ECG: sinus bradycardia 47 bpm with nonspecific ST-T changes and frequent PVCs  Results for orders placed or performed during the hospital encounter of 02/03/18 (from the past 24 hour(s))  CBC with Differential     Status: Abnormal   Collection Time: 02/03/18  1:48 AM  Result Value Ref Range   WBC 8.8 3.8 - 10.6 K/uL   RBC 4.63 4.40 - 5.90 MIL/uL   Hemoglobin 14.3 13.0 - 18.0 g/dL   HCT 41.8 40.0 - 52.0 %   MCV 90.1 80.0 - 100.0 fL   MCH 30.9 26.0 - 34.0 pg   MCHC 34.3 32.0 - 36.0 g/dL   RDW 13.7 11.5 - 14.5 %   Platelets 141 (L) 150 - 440 K/uL   Neutrophils Relative % 77 %   Neutro Abs 6.8 (H) 1.4 - 6.5 K/uL   Lymphocytes Relative 13 %   Lymphs Abs 1.1 1.0 - 3.6 K/uL   Monocytes Relative 7 %   Monocytes Absolute 0.6 0.2 - 1.0 K/uL   Eosinophils Relative 2 %   Eosinophils Absolute 0.2 0 - 0.7 K/uL   Basophils Relative 1 %   Basophils Absolute 0.1 0 - 0.1 K/uL  Comprehensive metabolic panel     Status: Abnormal   Collection Time: 02/03/18  1:48 AM  Result Value Ref Range   Sodium 139 135 - 145 mmol/L   Potassium 3.9 3.5 - 5.1 mmol/L   Chloride 109 101 - 111 mmol/L   CO2 20 (L) 22 - 32 mmol/L   Glucose, Bld 130 (H) 65 - 99 mg/dL   BUN 20 6 - 20 mg/dL   Creatinine, Ser 0.99 0.61 - 1.24 mg/dL   Calcium 8.6 (L) 8.9 - 10.3 mg/dL   Total Protein 6.5 6.5 - 8.1 g/dL   Albumin 3.8 3.5 - 5.0 g/dL   AST 16 15 - 41 U/L   ALT 14 (L) 17 - 63 U/L   Alkaline Phosphatase 54 38 - 126 U/L   Total Bilirubin 0.7 0.3 - 1.2 mg/dL   GFR calc non Af Amer >60 >60 mL/min   GFR calc Af Amer >60 >60 mL/min   Anion gap 10 5 - 15  Troponin I  Status: None   Collection Time: 02/03/18  1:48 AM  Result Value Ref Range   Troponin I <0.03 <0.03 ng/mL   Glucose, capillary     Status: Abnormal   Collection Time: 02/03/18  4:55 AM  Result Value Ref Range   Glucose-Capillary 109 (H) 65 - 99 mg/dL  Glucose, capillary     Status: Abnormal   Collection Time: 02/03/18  7:42 AM  Result Value Ref Range   Glucose-Capillary 110 (H) 65 - 99 mg/dL   Comment 1 Notify RN    Dg Chest Port 1 View  Result Date: 02/03/2018 CLINICAL DATA:  Acute onset of generalized weakness, bradycardia, nausea and vomiting. EXAM: PORTABLE CHEST 1 VIEW COMPARISON:  Chest radiograph performed 05/15/2005 FINDINGS: The lungs are well-aerated and clear. There is no evidence of focal opacification, pleural effusion or pneumothorax. The cardiomediastinal silhouette is within normal limits. No acute osseous abnormalities are seen. IMPRESSION: No acute cardiopulmonary process seen. Electronically Signed   By: Garald Balding M.D.   On: 02/03/2018 01:51     ASSESSMENT AND PLAN: nd presyncope with sinus bradycardia currently has a heart rate about 70 with frequent PVCs on the monitor no acute changes with cardiac enzymes being negative. Patient is feeling much better no further episodes of dizziness or lightheadedness.Discuss with  Dr. Emeline General about permanent pacemaker and she feels that the heart rate if it drops associated with dizzy spell while in telemetry will consider but not right now. Also we will evaluate outpatient for ischemia prior to placing permanent pacemaker. We will watch the patient closely for any pauses overnight as well as severe bradycardia on telemetry.  Lazer Wollard A

## 2018-02-03 NOTE — ED Provider Notes (Signed)
[facility] Emergency Department Provider Note   ____________________________________________   First MD Initiated Contact with Patient 02/03/18 0105     (approximate)  I have reviewed the triage vital signs and the nursing notes.   HISTORY  Chief Complaint Fatigue    HPI Billy Moss is a 77 y.o. male brought to the ED from home via EMS with a chief complaint of generalized weakness.  Patient reports not feeling well, feeling "out of it".  He thought he would feel better if he laid on the floor.  EMS reports upon their arrival patient was extremely diaphoretic and bradycardic with a heart rate in the 40s.  Patient does not take a beta-blocker.  EMS administered atropine which brought patient's heart rate up into the 50s.  Zofran given for nausea in route to the ED.  Patient states similar episode occurred last week but only lasted briefly.  Patient had a normal physical exam by his PCP just 3 days ago.  Denies recent fever, chills, chest pain, shortness of breath, abdominal pain, vomiting, dysuria, diarrhea.  Denies recent travel or trauma.   Past Medical History:  Diagnosis Date  . Barrett'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 dysplasia   . Benign tubular adenoma of large intestine   . BPH (benign prostatic hyperplasia)   . GERD (gastroesophageal reflux disease)   . Hyperlipidemia   . Vitamin B 12 deficiency     Patient Active Problem List   Diagnosis Date Noted  . Symptomatic sinus bradycardia 02/03/2018    Past Surgical History:  Procedure Laterality Date  . BACK SURGERY     LAMINECTOMY LUMBAR SPINE  . COLONOSCOPY  03/15/2012  . COLONOSCOPY WITH PROPOFOL N/A 06/23/2017   Procedure: COLONOSCOPY WITH PROPOFOL;  Surgeon: Manya Silvas, MD;  Location: [facility] ENDOSCOPY;  Service: Endoscopy;  Laterality: N/A;  . ESOPHAGOGASTRODUODENOSCOPY (EGD) WITH PROPOFOL N/A 06/23/2017   Procedure: ESOPHAGOGASTRODUODENOSCOPY (EGD) WITH PROPOFOL;  Surgeon: Manya Silvas, MD;  Location: [facility] ENDOSCOPY;  Service: Endoscopy;  Laterality: N/A;  . lamenectomy    . PROSTATE BIOPSY  1999  . VASECTOMY    . WRIST SURGERY      Prior to Admission medications   Medication Sig Start Date End Date Taking? Authorizing Provider  ascorbic acid (VITAMIN C) 250 MG tablet Take 1 tablet by mouth daily.   Yes [provider]  dutasteride (AVODART) 0.5 MG capsule Take 0.5 mg by mouth daily.   Yes [provider]  fexofenadine-pseudoephedrine (ALLEGRA-D) 60-120 MG 12 hr tablet Take 1 tablet by mouth 2 (two) times daily.   Yes [provider]  ibuprofen (ADVIL,MOTRIN) 200 MG tablet Take 200 mg by mouth every 6 (six) hours as needed.   Yes [provider]  pantoprazole (PROTONIX) 40 MG tablet Take 40 mg by mouth daily.   Yes [provider]  simvastatin (ZOCOR) 20 MG tablet Take 20 mg by mouth daily.   Yes [provider]  Triamcinolone Acetonide (NASACORT AQ NA) Place 1 spray into the nose daily.   Yes [provider]  vitamin B-12 (CYANOCOBALAMIN) 1000 MCG tablet Take 1 tablet by mouth daily.   Yes [provider]    Allergies Sulfa antibiotics  History reviewed. No pertinent family history.  Social History Social History   Tobacco Use  . Smoking status: Former Smoker    Types: Cigarettes    Last attempt to quit: 01/13/1984    Years since quitting: 34.0  . Smokeless tobacco: Never Used  Substance  Use Topics  . Alcohol use: Yes    Alcohol/week: 3.6 oz    Types: 6 Shots of liquor per week  . Drug use: No    Review of Systems  Constitutional: Positive for generalized weakness.  No fever/chills Eyes: No visual changes. ENT: No sore throat. Cardiovascular: Denies chest pain. Respiratory: Denies shortness of breath. Gastrointestinal: No abdominal pain.  Positive for nausea, no vomiting.  No diarrhea.  No constipation. Genitourinary: Negative for dysuria. Musculoskeletal: Negative for back  pain. Skin: Negative for rash. Neurological: Negative for headaches, focal weakness or numbness.   ____________________________________________   PHYSICAL EXAM:  VITAL SIGNS: ED Triage Vitals  Enc Vitals Group     BP      Pulse      Resp      Temp      Temp src      SpO2      Weight      Height      Head Circumference      Peak Flow      Pain Score      Pain Loc      Pain Edu?      Excl. in [place]?     Constitutional: Alert and oriented.  Weak appearing and in mild acute distress. Eyes: Conjunctivae are normal. PERRL. EOMI. Head: Atraumatic. Nose: No congestion/rhinnorhea. Mouth/Throat: Mucous membranes are moist.  Oropharynx non-erythematous. Neck: No stridor.  No carotid bruits. Cardiovascular: Normal rate, regular rhythm. Grossly normal heart sounds.  Good peripheral circulation. Respiratory: Normal respiratory effort.  No retractions. Lungs CTAB. Gastrointestinal: Soft and nontender to light or deep palpation. No distention. No abdominal bruits. No CVA tenderness. Musculoskeletal: No lower extremity tenderness nor edema.  No joint effusions. Neurologic:  Normal speech and language. No gross focal neurologic deficits are appreciated. No gait instability. Skin:  Skin is warm, clammy and intact. No rash noted. Psychiatric: Mood and affect are normal. Speech and behavior are normal.  ____________________________________________   LABS (all labs ordered are listed, but only abnormal results are displayed)  Labs Reviewed  CBC WITH DIFFERENTIAL/PLATELET - Abnormal; Notable for the following components:      Result Value   Platelets 141 (*)    Neutro Abs 6.8 (*)    All other components within normal limits  COMPREHENSIVE METABOLIC PANEL - Abnormal; Notable for the following components:   CO2 20 (*)    Glucose, Bld 130 (*)    Calcium 8.6 (*)    ALT 14 (*)    All other components within normal limits  GLUCOSE, CAPILLARY - Abnormal; Notable for the following  components:   Glucose-Capillary 109 (*)    All other components within normal limits  TROPONIN I  URINALYSIS, COMPLETE (UACMP) WITH MICROSCOPIC  TROPONIN I  TROPONIN I  MAGNESIUM  PHOSPHORUS   ____________________________________________  EKG  ED ECG REPORT I, Malajah Oceguera J, the attending physician, personally viewed and interpreted this ECG.   Date: 02/03/2018  EKG Time: 0116  Rate: 57  Rhythm: sinus bradycardia  Axis: Normal  Intervals:right bundle branch block  ST&T Change: Nonspecific  ____________________________________________  RADIOLOGY  ED MD interpretation: No acute cardiopulmonary process  Official radiology report(s): Dg Chest Port 1 View  Result Date: 02/03/2018 CLINICAL DATA:  Acute onset of generalized weakness, bradycardia, nausea and vomiting. EXAM: PORTABLE CHEST 1 VIEW COMPARISON:  Chest radiograph performed 05/15/2005 FINDINGS: The lungs are well-aerated and clear. There is no evidence of focal opacification, pleural effusion or pneumothorax. The cardiomediastinal silhouette is  within normal limits. No acute osseous abnormalities are seen. IMPRESSION: No acute cardiopulmonary process seen. Electronically Signed   By: Garald Balding M.D.   On: 02/03/2018 01:51    ____________________________________________   PROCEDURES  Procedure(s) performed: None  Procedures  Critical Care performed: No  ____________________________________________   INITIAL IMPRESSION / ASSESSMENT AND PLAN / ED COURSE  As part of my medical decision making, I reviewed the following data within the [place] History obtained from family, Nursing notes reviewed and incorporated, Labs reviewed, EKG interpreted, Old chart reviewed, Radiograph reviewed, Discussed with admitting physician and Notes from prior ED visits   77 year old male with BPH, Barrett's esophagus who presents with generalized weakness.  Differential diagnosis includes but is not limited to  ACS, CVA, metabolic, infectious etiologies, etc.  Will obtain screening lab work including troponin, initiate IV fluid resuscitation.   Clinical Course as of Feb 03 457  Thu Feb 03, 2018  0301 Patient resting in no acute distress.  Reportedly the thermometer was not working; patient's actual temperature is 97.4 F orally.  Updated patient and spouse of laboratory and imaging results.  Discussed with hospitalist to evaluate patient in the emergency department for admission.   [JS]    Clinical Course User Index [JS] Paulette Blanch, MD     ____________________________________________   FINAL CLINICAL IMPRESSION(S) / ED DIAGNOSES  Final diagnoses:  Weakness  Bradycardia     ED Discharge Orders    None       Note:  This document was prepared using Dragon voice recognition software and may include unintentional dictation errors.    Paulette Blanch, MD 02/03/18 [phone]

## 2018-02-03 NOTE — Progress Notes (Signed)
Orthostatic BP unremarkable.  Patient states he feels much better than yesterday.

## 2018-02-03 NOTE — Care Management Obs Status (Signed)
[place] NOTIFICATION   Patient Details  Name: CHIA ROCK MRN: [id number] Date of Birth: April 10, 1941   Medicare Observation Status Notification Given:  Yes    Shelbie Ammons, RN 02/03/2018, 11:05 AM

## 2018-02-03 NOTE — Progress Notes (Signed)
[place] [facility] [place] NAME: Billy Moss    MR#:  [id number]  DATE OF BIRTH:  01-19-1941  SUBJECTIVE:   Patient presents to the hospital due to multiple episodes of presyncope and noted to be mildly bradycardic.  No further bradycardia or pauses overnight on telemetry.  No other complaints presently.  Patient denies any chest pains, shortness of breath, nausea, vomiting.  REVIEW OF SYSTEMS:    Review of Systems  Constitutional: Negative for chills and fever.  HENT: Negative for congestion and tinnitus.   Eyes: Negative for blurred vision and double vision.  Respiratory: Negative for cough, shortness of breath and wheezing.   Cardiovascular: Negative for chest pain, orthopnea and PND.  Gastrointestinal: Negative for abdominal pain, diarrhea, nausea and vomiting.  Genitourinary: Negative for dysuria and hematuria.  Neurological: Negative for dizziness, sensory change and focal weakness.  All other systems reviewed and are negative.   Nutrition: Heart Healthy Tolerating Diet: Yes Tolerating PT: Await Eval.   DRUG ALLERGIES:   Allergies  Allergen Reactions  . Sulfa Antibiotics Rash    VITALS:  Blood pressure (!) 122/91, pulse 66, temperature 97.7 F (36.5 C), temperature source Oral, resp. rate 14, height 5\' 11"  (1.803 m), weight 93.4 kg (206 lb), SpO2 96 %.  PHYSICAL EXAMINATION:   Physical Exam  GENERAL:  77 y.o.-year-old patient lying in bed in no acute distress.  EYES: Pupils equal, round, reactive to light and accommodation. No scleral icterus. Extraocular muscles intact.  HEENT: Head atraumatic, normocephalic. Oropharynx and nasopharynx clear.  NECK:  Supple, no jugular venous distention. No thyroid enlargement, no tenderness.  LUNGS: Normal breath sounds bilaterally, no wheezing, rales, rhonchi. No use of accessory muscles of respiration.  CARDIOVASCULAR: S1, S2 normal. No murmurs, rubs, or gallops.  ABDOMEN: Soft,  nontender, nondistended. Bowel sounds present. No organomegaly or mass.  EXTREMITIES: No cyanosis, clubbing or edema b/l.    NEUROLOGIC: Cranial nerves II through XII are intact. No focal Motor or sensory deficits b/l.   PSYCHIATRIC: The patient is alert and oriented x 3.  SKIN: No obvious rash, lesion, or ulcer.    LABORATORY PANEL:   CBC Recent Labs  Lab 02/03/18 0148  WBC 8.8  HGB 14.3  HCT 41.8  PLT 141*   ------------------------------------------------------------------------------------------------------------------  Chemistries  Recent Labs  Lab 02/03/18 0148 02/03/18 0943  NA 139  --   K 3.9  --   CL 109  --   CO2 20*  --   GLUCOSE 130*  --   BUN 20  --   CREATININE 0.99  --   CALCIUM 8.6*  --   MG  --  2.1  AST 16  --   ALT 14*  --   ALKPHOS 54  --   BILITOT 0.7  --    ------------------------------------------------------------------------------------------------------------------  Cardiac Enzymes Recent Labs  Lab 02/03/18 0943  TROPONINI <0.03   ------------------------------------------------------------------------------------------------------------------  RADIOLOGY:  Dg Chest Port 1 View  Result Date: 02/03/2018 CLINICAL DATA:  Acute onset of generalized weakness, bradycardia, nausea and vomiting. EXAM: PORTABLE CHEST 1 VIEW COMPARISON:  Chest radiograph performed 05/15/2005 FINDINGS: The lungs are well-aerated and clear. There is no evidence of focal opacification, pleural effusion or pneumothorax. The cardiomediastinal silhouette is within normal limits. No acute osseous abnormalities are seen. IMPRESSION: No acute cardiopulmonary process seen. Electronically Signed   By: Garald Balding M.D.   On: 02/03/2018 01:51     ASSESSMENT AND PLAN:   77 year old male with  past medical history of BPH, GERD, history of Barrett's esophagus, hyperlipidemia who presents to the hospital due to multiple episodes of presyncope.  1.  Presyncope- secondary to  mild bradycardia.  Patient had heart rates in the 40s as per EMS. - No further episodes of bradycardia or sinus pauses on telemetry.  He has had no further episodes of presyncope. -Cardiac markers have been negative.  Echocardiogram has been done but results are pending. - Seen by cardiology and appreciate input.  Patient would benefit from a event monitor as an outpatient upon discharge.  Patient is not on any rate controlling medications presently. -Orthostatic vital signs were negative and will repeat them this afternoon.  2.  Hyperlipidemia-continue Zocor.  3.  GERD-continue Protonix.  4.  BPH-continue Avodart.     All the records are reviewed and case discussed with Care Management/Social Worker. Management plans discussed with the patient, family and they are in agreement.  CODE STATUS: Full code  DVT Prophylaxis: Lovenox  TOTAL TIME TAKING CARE OF THIS PATIENT: 30 minutes.   POSSIBLE D/C IN 1-2 DAYS, DEPENDING ON CLINICAL CONDITION.   Henreitta Leber M.D on 02/03/2018 at 2:49 PM  Between 7am to 6pm - Pager - [phone]  After 6pm go to [email or web address] - Proofreader  [facility]  Office  [phone]  CC: Primary care physician; Rusty Aus, MD

## 2018-02-03 NOTE — ED Notes (Signed)
Pt arrived via [facility] for weakness with N/V and diaphoresis. CAOx4. Denies CP or SOB. Pt reports "I just feel weak and I'm not myself." Pt pale and nauseous. Pt denies syncope. Pt was sinus brady in the 40's, was given 1mg  atropine and 4mg  zofran by EMS. Pt wife remains at bedside.

## 2018-02-03 NOTE — Care Management Note (Signed)
Case Management Note  Patient Details  Name: REVERE MAAHS MRN: [id number] Date of Birth: 24-Feb-1941  Subjective/Objective:  Admitted to [facility] under observation status with the diagnosis of symptomatic sinus brady. Lives with wife, Roland Rack [phone]). Last seen Dr. Emily Filbert 01/31/18, Prescriptions are filled at [facility] on [address]. No home health. No skilled facility. No home oxygen. No medical equipment in the home. Takes care of all basic activities of daily living himself, drives. No falls. Always a good appetite. Wife will transport                  Action/Plan: No follow-up needs identified at this time   Expected Discharge Date:                  Expected Discharge Plan:     In-House Referral:   yes  Discharge planning Services   yes  Post Acute Care Choice:    Choice offered to:     DME Arranged:    DME Agency:     HH Arranged:    [place] Agency:     Status of Service:     If discussed at [employer] [facility] [employer], dates discussed:    Additional Comments:  Shelbie Ammons, RN MSN CCM Care Management [phone] 02/03/2018, 11:05 AM

## 2018-02-04 DIAGNOSIS — R55 Syncope and collapse: Secondary | ICD-10-CM | POA: Diagnosis not present

## 2018-02-04 NOTE — Progress Notes (Signed)
SUBJECTIVE: patient denies any dizziness or headache or chest pain   Vitals:   02/03/18 1937 02/04/18 0300 02/04/18 0408 02/04/18 0741  BP: (!) 155/84  131/69 (!) 143/80  Pulse: (!) 54  (!) 50 (!) 50  Resp: 18  18   Temp: 98.3 F (36.8 C)  98 F (36.7 C) 97.8 F (36.6 C)  TempSrc: Oral  Oral Oral  SpO2: 97%  94% 96%  Weight:  209 lb 6.4 oz (95 kg)    Height:        Intake/Output Summary (Last 24 hours) at 02/04/2018 0910 Last data filed at 02/04/2018 0412 Gross per 24 hour  Intake 1253 ml  Output 1925 ml  Net -672 ml    LABS: Basic Metabolic Panel: Recent Labs    02/03/18 0148 02/03/18 0943  NA 139  --   K 3.9  --   CL 109  --   CO2 20*  --   GLUCOSE 130*  --   BUN 20  --   CREATININE 0.99  --   CALCIUM 8.6*  --   MG  --  2.1  PHOS  --  3.6   Liver Function Tests: Recent Labs    02/03/18 0148  AST 16  ALT 14*  ALKPHOS 54  BILITOT 0.7  PROT 6.5  ALBUMIN 3.8   No results for input(s): LIPASE, AMYLASE in the last 72 hours. CBC: Recent Labs    02/03/18 0148  WBC 8.8  NEUTROABS 6.8*  HGB 14.3  HCT 41.8  MCV 90.1  PLT 141*   Cardiac Enzymes: Recent Labs    02/03/18 0148 02/03/18 0943  TROPONINI <0.03 <0.03   BNP: Invalid input(s): POCBNP D-Dimer: No results for input(s): DDIMER in the last 72 hours. Hemoglobin A1C: No results for input(s): HGBA1C in the last 72 hours. Fasting Lipid Panel: No results for input(s): CHOL, HDL, LDLCALC, TRIG, CHOLHDL, LDLDIRECT in the last 72 hours. Thyroid Function Tests: No results for input(s): TSH, T4TOTAL, T3FREE, THYROIDAB in the last 72 hours.  Invalid input(s): FREET3 Anemia Panel: No results for input(s): VITAMINB12, FOLATE, FERRITIN, TIBC, IRON, RETICCTPCT in the last 72 hours.   PHYSICAL EXAM General: Well developed, well nourished, in no acute distress HEENT:  Normocephalic and atramatic Neck:  No JVD.  Lungs: Clear bilaterally to auscultation and percussion. Heart: HRRR . Normal S1 and S2  without gallops or murmurs.  Abdomen: Bowel sounds are positive, abdomen soft and non-tender  Msk:  Back normal, normal gait. Normal strength and tone for age. Extremities: No clubbing, cyanosis or edema.   Neuro: Alert and oriented X 3. Psych:  Good affect, responds appropriately  TELEMETRY: sinus rhythm with a heart rate as low as 50 but no severe bradycardia  ASSESSMENT AND PLAN: status post bradycardia apparently noted in the ER but over here on the monitor not less than 50. Patient is feeling fine and has ruled out for myocardial infarction and can be discharged with follow-up in the office 1 PM on Monday and will do outpatient workup such as event monitor as well as rule out ischemia.  Active Problems:   Symptomatic sinus bradycardia    Billy Moss A, MD, [facility] 02/04/2018 9:10 AM

## 2018-02-04 NOTE — Progress Notes (Signed)
Discharged to home with his wife.  Follow up with Dr. Humphrey Rolls, Cardiology.

## 2018-02-04 NOTE — Discharge Summary (Signed)
[place] [facility] [place] NAME: Billy Moss    MR#:  [id number]  DATE OF BIRTH:  [birth year]  DATE OF ADMISSION:  02/03/2018 ADMITTING PHYSICIAN: Arta Silence, MD  DATE OF DISCHARGE: 02/04/2018 12:07 PM  PRIMARY CARE PHYSICIAN: Rusty Aus, MD    ADMISSION DIAGNOSIS:  Bradycardia [R00.1] Weakness [R53.1]  DISCHARGE DIAGNOSIS:  Active Problems:   Symptomatic sinus bradycardia   SECONDARY DIAGNOSIS:   Past Medical History:  Diagnosis Date  . Barrett'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 dysplasia   . Benign tubular adenoma of large intestine   . BPH (benign prostatic hyperplasia)   . GERD (gastroesophageal reflux disease)   . Hyperlipidemia   . Vitamin B 12 deficiency     HOSPITAL COURSE:   77 year old male with past medical history of BPH, GERD, history of Barrett's esophagus, hyperlipidemia who presents to the hospital due to multiple episodes of presyncope.  1.  Presyncope- patient presented to the hospital due to multiple episodes of presyncope. - He was observed overnight on telemetry, had no sinus pauses or bradycardia.  When he came by EMS to the hospital his heart rate was as low in the 40s.  He was seen by cardiology and did not recommend any acute intervention, and patient had no acute indication for pacemaker. -His orthostatic vital signs remain negative.  Patient apparently was given some Sudafed for his allergic rhinitis which he thinks could have contributed to his presyncope but that is highly unlikely. - Patient is not asymptomatic upon ambulation and has had no further episodes of presyncope and therefore being discharged home with outpatient cardiology follow-up.  He would benefit from a event/loop monitor as an outpatient. -His cardiac markers were negative, his echocardiogram showed normal ejection fraction with no acute wall motion abnormalities.  2.  Hyperlipidemia- he will continue Zocor.  3.  GERD-continue  Protonix.  4.  BPH-continue Avodart.    DISCHARGE CONDITIONS:   Stable  CONSULTS OBTAINED:  Treatment Team:  Arta Silence, MD Dionisio David, MD  DRUG ALLERGIES:   Allergies  Allergen Reactions  . Sulfa Antibiotics Rash    DISCHARGE MEDICATIONS:   Allergies as of 02/04/2018      Reactions   Sulfa Antibiotics Rash      Medication List    STOP taking these medications   fexofenadine-pseudoephedrine 60-120 MG 12 hr tablet Commonly known as:  ALLEGRA-D     TAKE these medications   ascorbic acid 250 MG tablet Commonly known as:  VITAMIN C Take 1 tablet by mouth daily. Notes to patient:  None given today   dutasteride 0.5 MG capsule Commonly known as:  AVODART Take 0.5 mg by mouth daily.   ibuprofen 200 MG tablet Commonly known as:  ADVIL,MOTRIN Take 200 mg by mouth every 6 (six) hours as needed. Notes to patient:  NONE GIVEN TODAY   NASACORT AQ NA Place 1 spray into the nose daily. Notes to patient:  NONE GIVEN TODAY   pantoprazole 40 MG tablet Commonly known as:  PROTONIX Take 40 mg by mouth daily.   simvastatin 20 MG tablet Commonly known as:  ZOCOR Take 20 mg by mouth daily.   vitamin B-12 1000 MCG tablet Commonly known as:  CYANOCOBALAMIN Take 1 tablet by mouth daily. Notes to patient:  NONE GIVEN TODAY         DISCHARGE INSTRUCTIONS:   DIET:  Regular diet  DISCHARGE CONDITION:  Stable  ACTIVITY:  Activity as tolerated  OXYGEN:  Home  Oxygen: No.   Oxygen Delivery: room air  DISCHARGE LOCATION:  home   If you experience worsening of your admission symptoms, develop shortness of breath, life threatening emergency, suicidal or homicidal thoughts you must seek medical attention immediately by calling 911 or calling your MD immediately  if symptoms less severe.  You Must read complete instructions/literature along with all the possible adverse reactions/side effects for all the Medicines you take and that have been  prescribed to you. Take any new Medicines after you have completely understood and accpet all the possible adverse reactions/side effects.   Please note  You were cared for by a hospitalist during your hospital stay. If you have any questions about your discharge medications or the care you received while you were in the hospital after you are discharged, you can call the unit and asked to speak with the hospitalist on call if the hospitalist that took care of you is not available. Once you are discharged, your primary care physician will handle any further medical issues. Please note that NO REFILLS for any discharge medications will be authorized once you are discharged, as it is imperative that you return to your primary care physician (or establish a relationship with a primary care physician if you do not have one) for your aftercare needs so that they can reassess your need for medications and monitor your lab values.     Today   No further episodes of presyncope.  No sinus pauses or evidence of bradycardia.    VITAL SIGNS:  Blood pressure (!) 143/80, pulse (!) 50, temperature 97.8 F (36.6 C), temperature source Oral, resp. rate 18, height 5\' 11"  (1.803 m), weight 95 kg (209 lb 6.4 oz), SpO2 96 %.  I/O:    Intake/Output Summary (Last 24 hours) at 02/04/2018 1511 Last data filed at 02/04/2018 0950 Gross per 24 hour  Intake 1253 ml  Output 1525 ml  Net -272 ml    PHYSICAL EXAMINATION:  GENERAL:  77 y.o.-year-old patient lying in the bed with no acute distress.  EYES: Pupils equal, round, reactive to light and accommodation. No scleral icterus. Extraocular muscles intact.  HEENT: Head atraumatic, normocephalic. Oropharynx and nasopharynx clear.  NECK:  Supple, no jugular venous distention. No thyroid enlargement, no tenderness.  LUNGS: Normal breath sounds bilaterally, no wheezing, rales,rhonchi. No use of accessory muscles of respiration.  CARDIOVASCULAR: S1, S2 normal. No murmurs,  rubs, or gallops.  ABDOMEN: Soft, non-tender, non-distended. Bowel sounds present. No organomegaly or mass.  EXTREMITIES: No pedal edema, cyanosis, or clubbing.  NEUROLOGIC: Cranial nerves II through XII are intact. No focal motor or sensory defecits b/l.  PSYCHIATRIC: The patient is alert and oriented x 3.  SKIN: No obvious rash, lesion, or ulcer.   DATA REVIEW:   CBC Recent Labs  Lab 02/03/18 0148  WBC 8.8  HGB 14.3  HCT 41.8  PLT 141*    Chemistries  Recent Labs  Lab 02/03/18 0148 02/03/18 0943  NA 139  --   K 3.9  --   CL 109  --   CO2 20*  --   GLUCOSE 130*  --   BUN 20  --   CREATININE 0.99  --   CALCIUM 8.6*  --   MG  --  2.1  AST 16  --   ALT 14*  --   ALKPHOS 54  --   BILITOT 0.7  --     Cardiac Enzymes Recent Labs  Lab 02/03/18 0943  TROPONINI <0.03  Microbiology Results  No results found for this or any previous visit.  RADIOLOGY:  Dg Chest Port 1 View  Result Date: 02/03/2018 CLINICAL DATA:  Acute onset of generalized weakness, bradycardia, nausea and vomiting. EXAM: PORTABLE CHEST 1 VIEW COMPARISON:  Chest radiograph performed 05/15/2005 FINDINGS: The lungs are well-aerated and clear. There is no evidence of focal opacification, pleural effusion or pneumothorax. The cardiomediastinal silhouette is within normal limits. No acute osseous abnormalities are seen. IMPRESSION: No acute cardiopulmonary process seen. Electronically Signed   By: Garald Balding M.D.   On: 02/03/2018 01:51      Management plans discussed with the patient, family and they are in agreement.  CODE STATUS:     Code Status Orders  (From admission, onward)        Start     Ordered   02/03/18 0415  Full code  Continuous     02/03/18 0414    Code Status History    Date Active Date Inactive Code Status Order ID Comments User Context   02/03/2018 0414 02/03/2018 0414 Full Code [id number]  Arta Silence, MD ED    Advance Directive Documentation     Most Recent  Value  Type of Advance Directive  Healthcare Power of Attorney, Living will  Pre-existing out of facility DNR order (yellow form or pink MOST form)  -  "MOST" Form in Place?  -      TOTAL TIME TAKING CARE OF THIS PATIENT: 40 minutes.    Henreitta Leber M.D on 02/04/2018 at 3:11 PM  Between 7am to 6pm - Pager - [phone]  After 6pm go to [email or web address] - Proofreader  [facility]  Office  [phone]  CC: Primary care physician; Rusty Aus, MD

## 2018-09-06 ENCOUNTER — Encounter: Payer: Self-pay | Admitting: *Deleted

## 2018-09-07 ENCOUNTER — Encounter
Admission: RE | Disposition: A | Discharge status: Home / Self Care | Payer: Self-pay | Source: Home / Self Care | Attending: Unknown Physician Specialty

## 2018-09-07 ENCOUNTER — Ambulatory Visit
Admission: RE | Admit: 2018-09-07 | Discharge: 2018-09-07 | Disposition: A | Discharge status: Home / Self Care | Payer: Medicare Other | Attending: Unknown Physician Specialty | Admitting: Unknown Physician Specialty

## 2018-09-07 ENCOUNTER — Encounter: Payer: Self-pay | Admitting: *Deleted

## 2018-09-07 ENCOUNTER — Ambulatory Visit: Payer: Medicare Other | Admitting: Anesthesiology

## 2018-09-07 DIAGNOSIS — N400 Enlarged prostate without lower urinary tract symptoms: Secondary | ICD-10-CM | POA: Diagnosis not present

## 2018-09-07 DIAGNOSIS — E785 Hyperlipidemia, unspecified: Secondary | ICD-10-CM | POA: Diagnosis not present

## 2018-09-07 DIAGNOSIS — Z79899 Other long term (current) drug therapy: Secondary | ICD-10-CM | POA: Insufficient documentation

## 2018-09-07 DIAGNOSIS — K5730 Diverticulosis of large intestine without perforation or abscess without bleeding: Secondary | ICD-10-CM | POA: Insufficient documentation

## 2018-09-07 DIAGNOSIS — K640 First degree hemorrhoids: Secondary | ICD-10-CM | POA: Diagnosis not present

## 2018-09-07 DIAGNOSIS — E538 Deficiency of other specified B group vitamins: Secondary | ICD-10-CM | POA: Insufficient documentation

## 2018-09-07 DIAGNOSIS — Z86010 Personal history of colonic polyps: Secondary | ICD-10-CM | POA: Diagnosis not present

## 2018-09-07 DIAGNOSIS — Z1211 Encounter for screening for malignant neoplasm of colon: Secondary | ICD-10-CM | POA: Diagnosis present

## 2018-09-07 DIAGNOSIS — Z87891 Personal history of nicotine dependence: Secondary | ICD-10-CM | POA: Insufficient documentation

## 2018-09-07 DIAGNOSIS — K219 Gastro-esophageal reflux disease without esophagitis: Secondary | ICD-10-CM | POA: Insufficient documentation

## 2018-09-07 HISTORY — PX: COLONOSCOPY WITH PROPOFOL: SHX5780

## 2018-09-07 SURGERY — COLONOSCOPY WITH PROPOFOL
Anesthesia: General

## 2018-09-07 MED ORDER — PROPOFOL 10 MG/ML IV BOLUS
INTRAVENOUS | Status: DC | PRN
  Administered 2018-09-07: 70 mg via INTRAVENOUS

## 2018-09-07 MED ORDER — SODIUM CHLORIDE 0.9 % IV SOLN: INTRAVENOUS | Status: DC

## 2018-09-07 MED ORDER — LIDOCAINE HCL (CARDIAC) PF 100 MG/5ML IV SOSY
PREFILLED_SYRINGE | INTRAVENOUS | Status: DC | PRN
  Administered 2018-09-07: 80 mg via INTRAVENOUS

## 2018-09-07 MED ORDER — SODIUM CHLORIDE 0.9 % IV SOLN
INTRAVENOUS | Status: DC
  Administered 2018-09-07: 09:00:00 via INTRAVENOUS

## 2018-09-07 MED ORDER — PROPOFOL 500 MG/50ML IV EMUL
INTRAVENOUS | Status: DC | PRN
  Administered 2018-09-07: 100 ug/kg/min via INTRAVENOUS

## 2018-09-07 MED ORDER — PROPOFOL 10 MG/ML IV BOLUS
INTRAVENOUS | Status: AC
  Filled 2018-09-07: qty 40

## 2018-09-07 NOTE — H&P (Signed)
Primary Care Physician:  Rusty Aus, MD Primary Gastroenterologist:  Dr. Vira Agar  Pre-Procedure History & Physical: HPI:  Billy Moss is a 78 y.o. male is here for an colonoscopy.   Past Medical History:  Diagnosis Date  . Barrett'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 dysplasia   . Benign tubular adenoma of large intestine   . BPH (benign prostatic hyperplasia)   . GERD (gastroesophageal reflux disease)   . Hyperlipidemia   . Vitamin B 12 deficiency     Past Surgical History:  Procedure Laterality Date  . BACK SURGERY     LAMINECTOMY LUMBAR SPINE  . COLONOSCOPY  03/15/2012  . COLONOSCOPY WITH PROPOFOL N/A 06/23/2017   Procedure: COLONOSCOPY WITH PROPOFOL;  Surgeon: Manya Silvas, MD;  Location: [facility] ENDOSCOPY;  Service: Endoscopy;  Laterality: N/A;  . ESOPHAGOGASTRODUODENOSCOPY (EGD) WITH PROPOFOL N/A 06/23/2017   Procedure: ESOPHAGOGASTRODUODENOSCOPY (EGD) WITH PROPOFOL;  Surgeon: Manya Silvas, MD;  Location: [facility] ENDOSCOPY;  Service: Endoscopy;  Laterality: N/A;  . lamenectomy    . PROSTATE BIOPSY  1999  . VASECTOMY    . WRIST SURGERY      Prior to Admission medications   Medication Sig Start Date End Date Taking? Authorizing Provider  dutasteride (AVODART) 0.5 MG capsule Take 0.5 mg by mouth daily.   Yes [provider]  ibuprofen (ADVIL,MOTRIN) 200 MG tablet Take 200 mg by mouth every 6 (six) hours as needed.   Yes [provider]  pantoprazole (PROTONIX) 40 MG tablet Take 40 mg by mouth daily.   Yes [provider]  Triamcinolone Acetonide (NASACORT AQ NA) Place 1 spray into the nose daily.   Yes [provider]  ascorbic acid (VITAMIN C) 250 MG tablet Take 1 tablet by mouth daily.    [provider]  simvastatin (ZOCOR) 20 MG tablet Take 20 mg by mouth daily.    [provider]  vitamin B-12 (CYANOCOBALAMIN) 1000 MCG tablet Take 1 tablet by mouth daily.    [provider]    Allergies as of 06/17/2018 -  Review Complete 02/03/2018  Allergen Reaction Noted  . Sulfa antibiotics Rash 08/02/2016    History reviewed. No pertinent family history.  Social History   Socioeconomic History  . Marital status: Married    Spouse name: Not on file  . Number of children: Not on file  . Years of education: Not on file  . Highest education level: Not on file  Occupational History  . Not on file  Social Needs  . Financial resource strain: Not on file  . Food insecurity:    Worry: Not on file    Inability: Not on file  . Transportation needs:    Medical: Not on file    Non-medical: Not on file  Tobacco Use  . Smoking status: Former Smoker    Types: Cigarettes    Last attempt to quit: 01/13/1984    Years since quitting: 34.6  . Smokeless tobacco: Never Used  Substance and Sexual Activity  . Alcohol use: Yes    Alcohol/week: 6.0 standard drinks    Types: 6 Shots of liquor per week  . Drug use: No  . Sexual activity: Not on file  Lifestyle  . Physical activity:    Days per week: Not on file    Minutes per session: Not on file  . Stress: Not on file  Relationships  . Social connections:    Talks on phone: Not on file    Gets together: Not on  file    Attends religious service: Not on file    Active member of club or organization: Not on file    Attends meetings of clubs or organizations: Not on file    Relationship status: Not on file  . Intimate partner violence:    Fear of current or ex partner: Not on file    Emotionally abused: Not on file    Physically abused: Not on file    Forced sexual activity: Not on file  Other Topics Concern  . Not on file  Social History Narrative  . Not on file    Review of Systems: See HPI, otherwise negative ROS  Physical Exam: BP (!) 145/71   Temp (!) 96 F (35.6 C)   Ht 6' (1.829 m)   Wt 95.3 kg   SpO2 98%   BMI 28.48 kg/m  General:   Alert,  pleasant and cooperative in NAD Head:  Normocephalic and atraumatic. Neck:  Supple; no  masses or thyromegaly. Lungs:  Clear throughout to auscultation.    Heart:  Regular rate and rhythm. Abdomen:  Soft, nontender and nondistended. Normal bowel sounds, without guarding, and without rebound.   Neurologic:  Alert and  oriented x4;  grossly normal neurologically.  Impression/Plan: Billy Moss is here for an colonoscopy to be performed for Personal history of multiple polyps.  Risks, benefits, limitations, and alternatives regarding  colonoscopy have been reviewed with the patient.  Questions have been answered.  All parties agreeable.   Gaylyn Cheers, MD  09/07/2018, 9:14 AM

## 2018-09-07 NOTE — Anesthesia Post-op Follow-up Note (Signed)
Anesthesia QCDR form completed.        

## 2018-09-07 NOTE — Anesthesia Postprocedure Evaluation (Signed)
Anesthesia Post Note  Patient: Billy Moss  Procedure(s) Performed: COLONOSCOPY WITH PROPOFOL (N/A )  Patient location during evaluation: Endoscopy Anesthesia Type: General Level of consciousness: awake and alert and oriented Pain management: pain level controlled Vital Signs Assessment: post-procedure vital signs reviewed and stable Respiratory status: spontaneous breathing, nonlabored ventilation and respiratory function stable Cardiovascular status: blood pressure returned to baseline and stable Postop Assessment: no signs of nausea or vomiting Anesthetic complications: no     Last Vitals:  Vitals:   09/07/18 0954 09/07/18 1009  BP: 121/75 110/60  Pulse:    Resp:    Temp:    SpO2:      Last Pain:  Vitals:   09/07/18 1021  TempSrc:   PainSc: 0-No pain                 Natoshia Souter

## 2018-09-07 NOTE — Transfer of Care (Signed)
Immediate Anesthesia Transfer of Care Note  Patient: Billy Moss  Procedure(s) Performed: COLONOSCOPY WITH PROPOFOL (N/A )  Patient Location: PACU  Anesthesia Type:General  Level of Consciousness: awake, alert  and oriented  Airway & Oxygen Therapy: Patient Spontanous Breathing and Patient connected to nasal cannula oxygen  Post-op Assessment: Report given to RN and Post -op Vital signs reviewed and stable  Post vital signs: Reviewed and stable  Last Vitals:  Vitals Value Taken Time  BP 87/71 09/07/2018  9:47 AM  Temp 36.1 C 09/07/2018  9:46 AM  Pulse 68 09/07/2018  9:47 AM  Resp 23 09/07/2018  9:47 AM  SpO2 96 % 09/07/2018  9:47 AM  Vitals shown include unvalidated device data.  Last Pain:  Vitals:   09/07/18 0946  TempSrc: Tympanic  PainSc: 0-No pain         Complications: No apparent anesthesia complications

## 2018-09-07 NOTE — Op Note (Signed)
[facility]astro[facility] Patient Name: Billy Moss Procedure Date: 09/07/2018 9:02 AM MRN: [id number] Account #: [id number] Date of Birth: [birth year] Admit Type: Outpatient Age: 78 Room: [facility] ENDO ROOM 2 Gender: Male Note Status: Finalized Procedure:            Colonoscopy Indications:          High risk colon cancer surveillance: Personal history                        of adenoma (10 mm or greater in size) Providers:            Manya Silvas, MD Referring MD:         Rusty Aus, MD (Referring MD) Medicines:            Propofol per Anesthesia Complications:        No immediate complications. Procedure:            Pre-Anesthesia Assessment:                       - After reviewing the risks and benefits, the patient                        was deemed in satisfactory condition to undergo the                        procedure.                       After obtaining informed consent, the colonoscope was                        passed under direct vision. Throughout the procedure,                        the patient's blood pressure, pulse, and oxygen                        saturations were monitored continuously. The                        Colonoscope was introduced through the anus and                        advanced to the the cecum, identified by appendiceal                        orifice and ileocecal valve. The colonoscopy was                        performed without difficulty. The patient tolerated the                        procedure well. The quality of the bowel preparation                        was excellent. Findings:      Multiple small-medium-mouthed diverticula were found in the sigmoid       colon, descending colon, transverse colon and ascending colon.      Internal hemorrhoids were found during endoscopy. The hemorrhoids were       small  and Grade I (internal hemorrhoids that do not prolapse).      No polyps seen anywhere in the  colon. Impression:           - Diverticulosis in the sigmoid colon, in the                        descending colon, in the transverse colon and in the                        ascending colon.                       - Internal hemorrhoids.                       - No specimens collected. Recommendation:       - The findings and recommendations were discussed with                        the patient's family. Given age and no polyps would                        recommend no repeat colonoscopy unless bleeding or                        other event. Manya Silvas, MD 09/07/2018 9:51:34 AM This report has been signed electronically. Number of Addenda: 0 Note Initiated On: 09/07/2018 9:02 AM Scope Withdrawal Time: 0 hours 12 minutes 1 second  Total Procedure Duration: 0 hours 20 minutes 42 seconds       [facility]

## 2018-09-07 NOTE — Anesthesia Preprocedure Evaluation (Signed)
Anesthesia Evaluation  Patient identified by MRN, date of birth, ID band Patient awake    Reviewed: Allergy & Precautions, NPO status , Patient's Chart, lab work & pertinent test results  History of Anesthesia Complications Negative for: history of anesthetic complications  Airway Mallampati: II  TM Distance: >3 FB Neck ROM: Full    Dental  (+) Poor Dentition, Implants   Pulmonary neg sleep apnea, neg COPD, former smoker,    breath sounds clear to auscultation- rhonchi (-) wheezing      Cardiovascular Exercise Tolerance: Good (-) hypertension(-) CAD, (-) Past MI, (-) Cardiac Stents and (-) CABG  Rhythm:Regular Rate:Normal - Systolic murmurs and - Diastolic murmurs    Neuro/Psych neg Seizures negative neurological ROS  negative psych ROS   GI/Hepatic Neg liver ROS, GERD  ,  Endo/Other  negative endocrine ROSneg diabetes  Renal/GU negative Renal ROS     Musculoskeletal negative musculoskeletal ROS (+)   Abdominal (+) - obese,   Peds  Hematology negative hematology ROS (+)   Anesthesia Other Findings Past Medical History: No date: Barrett'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 dysplasia No date: Benign tubular adenoma of large intestine No date: BPH (benign prostatic hyperplasia) No date: GERD (gastroesophageal reflux disease) No date: Hyperlipidemia No date: Vitamin B 12 deficiency   Reproductive/Obstetrics                             Anesthesia Physical Anesthesia Plan  ASA: II  Anesthesia Plan: General   Post-op Pain Management:    Induction: Intravenous  PONV Risk Score and Plan: 1 and Propofol infusion  Airway Management Planned: Natural Airway  Additional Equipment:   Intra-op Plan:   Post-operative Plan:   Informed Consent: I have reviewed the patients History and Physical, chart, labs and discussed the procedure including the risks, benefits and alternatives for the proposed  anesthesia with the patient or authorized representative who has indicated his/her understanding and acceptance.   Dental advisory given  Plan Discussed with: CRNA and Anesthesiologist  Anesthesia Plan Comments:         Anesthesia Quick Evaluation

## 2018-09-08 ENCOUNTER — Encounter: Payer: Self-pay | Admitting: Unknown Physician Specialty

## 2019-08-22 ENCOUNTER — Ambulatory Visit: Payer: Medicare Other | Attending: Internal Medicine

## 2019-08-22 DIAGNOSIS — Z20822 Contact with and (suspected) exposure to covid-19: Secondary | ICD-10-CM

## 2019-08-24 LAB — NOVEL CORONAVIRUS, NAA: SARS-CoV-2, NAA: DETECTED — AB

## 2021-02-12 ENCOUNTER — Other Ambulatory Visit: Payer: Self-pay | Admitting: Internal Medicine

## 2021-02-12 DIAGNOSIS — N136 Pyonephrosis: Secondary | ICD-10-CM

## 2021-02-12 DIAGNOSIS — N3090 Cystitis, unspecified without hematuria: Secondary | ICD-10-CM

## 2021-03-04 ENCOUNTER — Ambulatory Visit
Admission: RE | Admit: 2021-03-04 | Discharge: 2021-03-04 | Disposition: A | Discharge status: Home / Self Care | Payer: Medicare Other | Source: Ambulatory Visit | Attending: Internal Medicine | Admitting: Internal Medicine

## 2021-03-04 ENCOUNTER — Other Ambulatory Visit: Payer: Self-pay

## 2021-03-04 DIAGNOSIS — N3090 Cystitis, unspecified without hematuria: Secondary | ICD-10-CM | POA: Diagnosis present

## 2021-03-04 DIAGNOSIS — N136 Pyonephrosis: Secondary | ICD-10-CM | POA: Diagnosis not present

## 2021-12-03 IMAGING — US US RENAL
1 series · 14 of 25 positions shown · non-contrast
Comparison: None.

CLINICAL DATA: Hydronephrosis.  UTI.

EXAM:
RENAL / URINARY TRACT ULTRASOUND COMPLETE

[Series 1: us renal · 0.25mm/px · 14 of 45 slices shown]
[im 1/45]
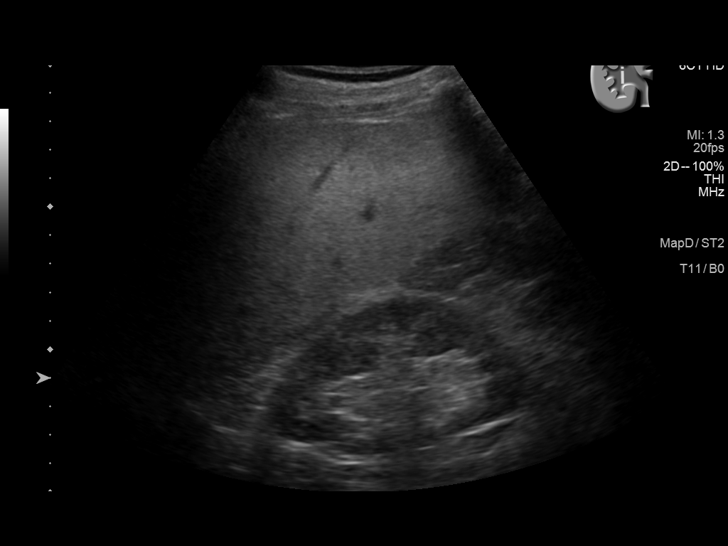
[im 4/45]
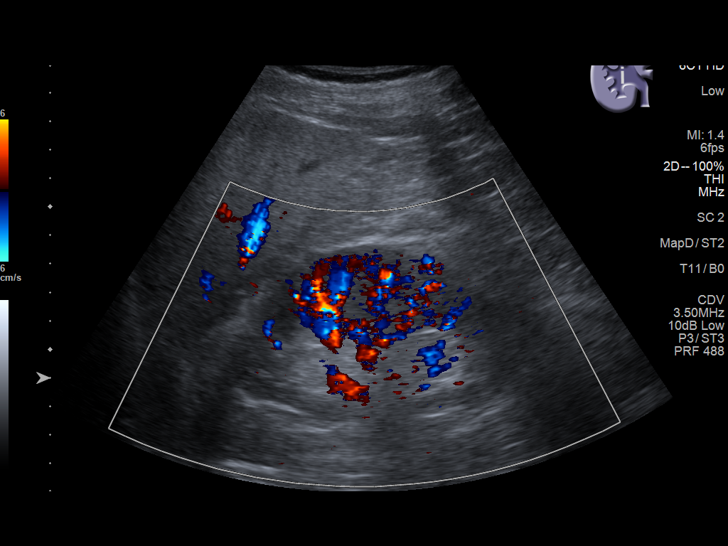
[im 8/45]
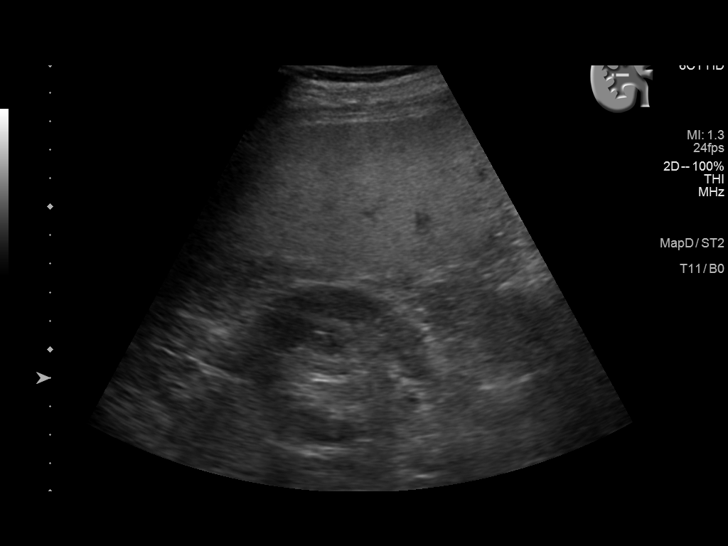
[im 12/45]
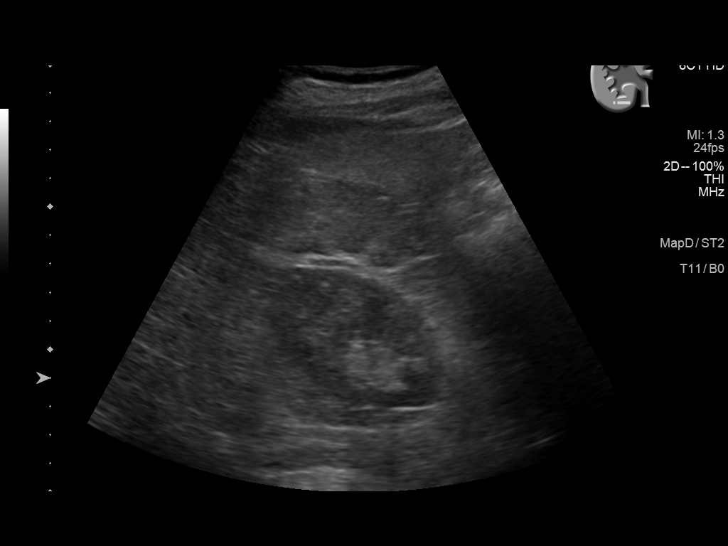
[im 15/45]
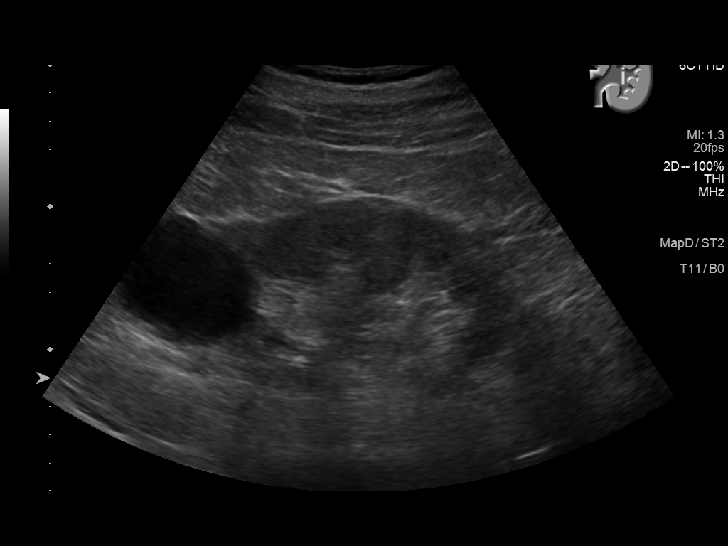
[im 17/45]
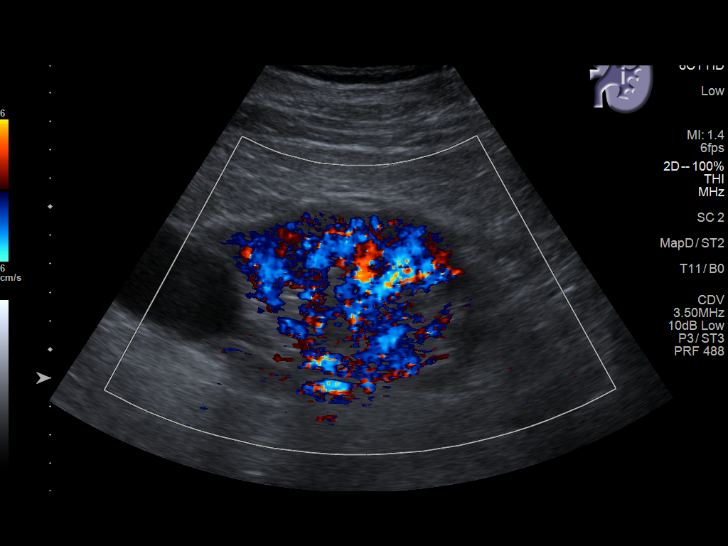
[im 21/45]
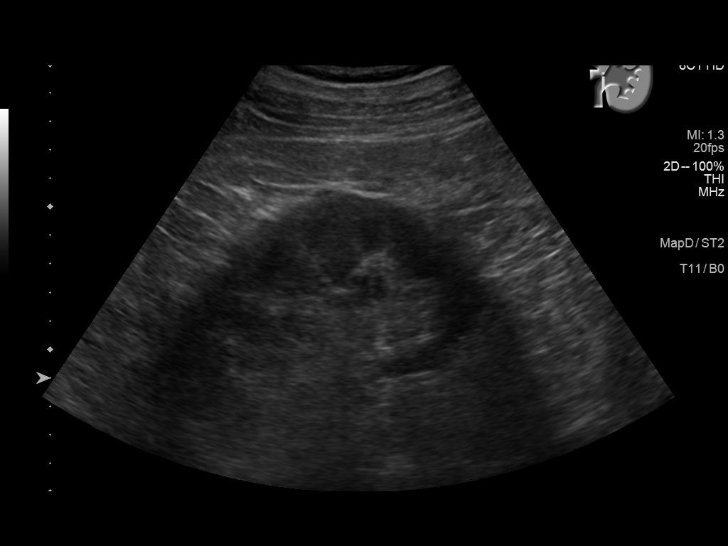
[im 24/45]
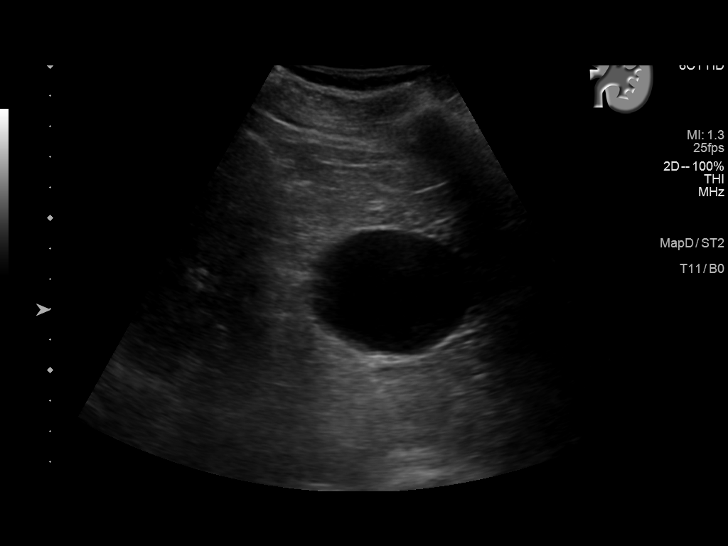
[im 28/45]
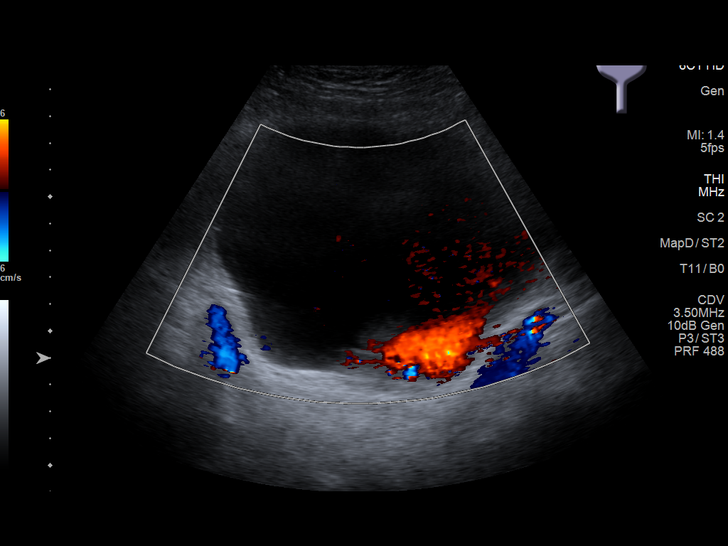
[im 30/45]
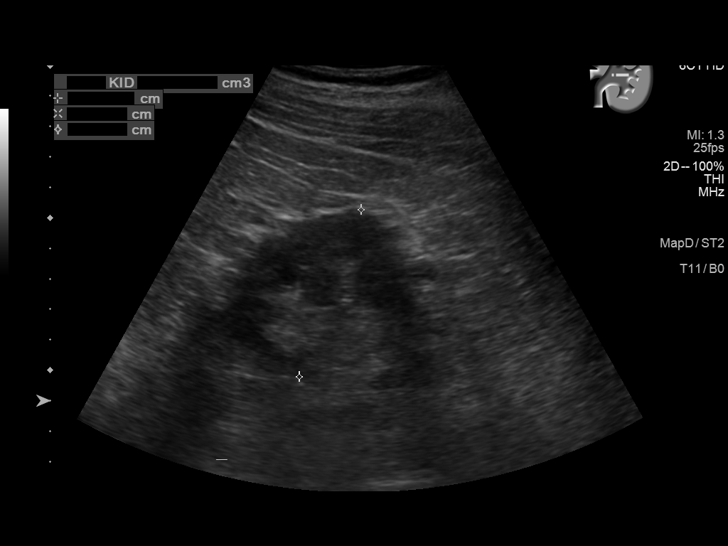
[im 34/45]
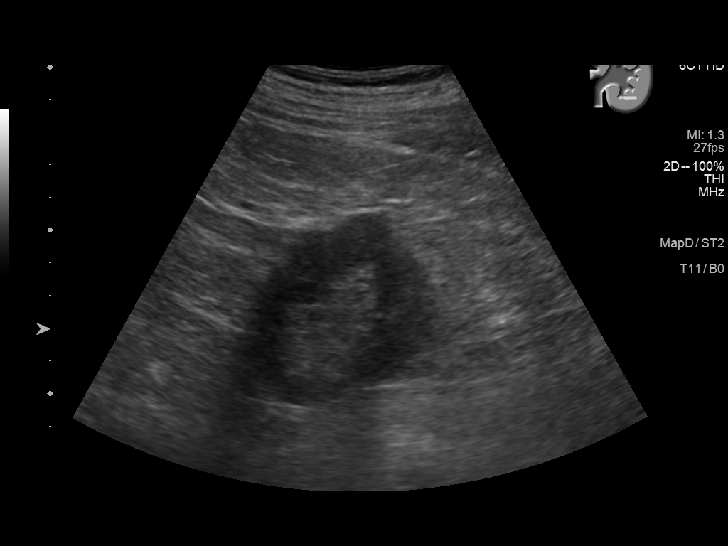
[im 37/45]
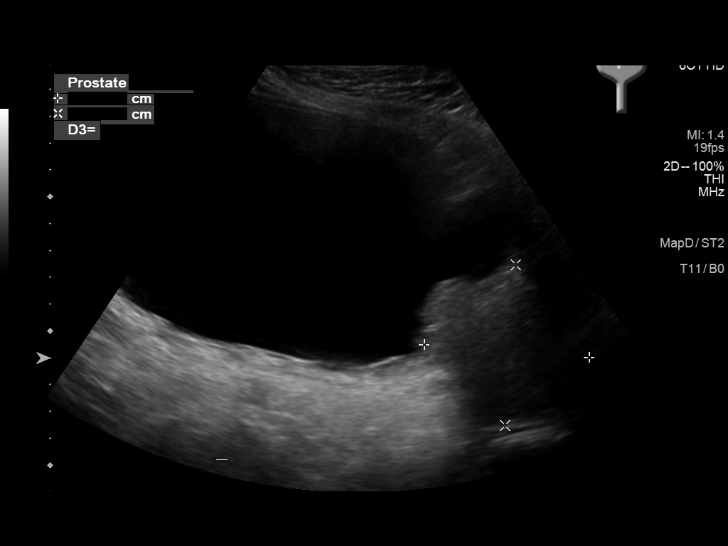
[im 41/45]
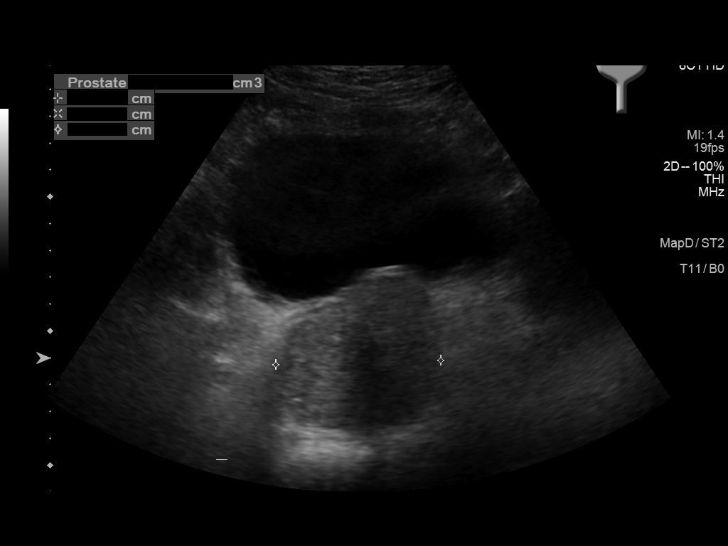
[im 45/45]
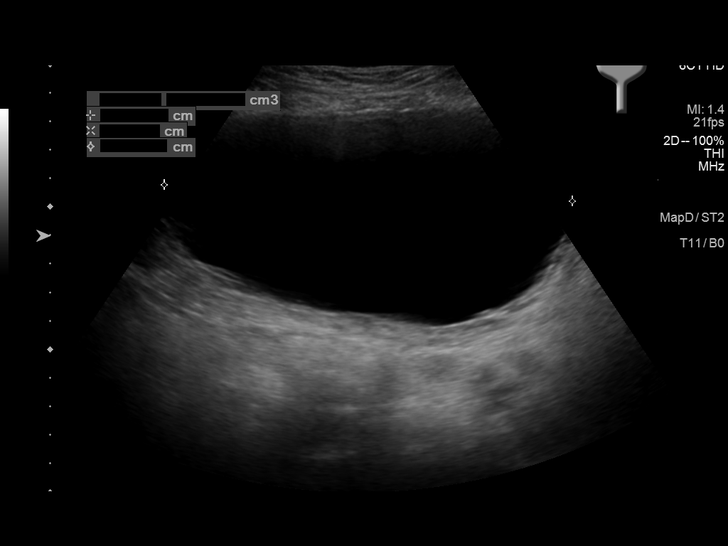

[14 of 25 positions shown; findings below may reference images not displayed]

FINDINGS: Right Kidney:

Renal measurements: 10.1 x 6.0 x 6.1 cm = volume: 193 mL.
Echogenicity within normal limits. No mass or hydronephrosis
visualized.

Left Kidney:

Renal measurements: 10.4 x 6.9 x 5.9 cm = volume: 220 mL.
Echogenicity within normal limits. No mass or hydronephrosis
visualized. 5.2 cm simple cyst in the upper pole.

Bladder:

Appears normal for degree of bladder distention.

Other:

Mild prostatomegaly.
IMPRESSION: 1. No hydronephrosis.
2. 5.2 cm simple cyst in the left kidney.
3. Mild prostatomegaly.

## 2023-10-20 ENCOUNTER — Other Ambulatory Visit: Payer: Self-pay | Admitting: Internal Medicine

## 2023-10-20 DIAGNOSIS — R053 Chronic cough: Secondary | ICD-10-CM

## 2023-10-20 DIAGNOSIS — J849 Interstitial pulmonary disease, unspecified: Secondary | ICD-10-CM

## 2023-11-02 ENCOUNTER — Ambulatory Visit
Admission: RE | Admit: 2023-11-02 | Discharge: 2023-11-02 | Disposition: A | Discharge status: Home / Self Care | Payer: Medicare Other | Source: Ambulatory Visit | Attending: Internal Medicine | Admitting: Internal Medicine

## 2023-11-02 DIAGNOSIS — R053 Chronic cough: Secondary | ICD-10-CM | POA: Diagnosis present

## 2023-11-02 DIAGNOSIS — J849 Interstitial pulmonary disease, unspecified: Secondary | ICD-10-CM | POA: Diagnosis present

## 2023-11-02 MED ORDER — IOHEXOL 300 MG/ML  SOLN
75.0000 mL | Freq: Once | INTRAMUSCULAR | Status: AC | PRN
  Administered 2023-11-02: 75 mL via INTRAVENOUS

## 2023-11-22 ENCOUNTER — Other Ambulatory Visit: Payer: Self-pay | Admitting: Internal Medicine

## 2023-11-22 DIAGNOSIS — R1900 Intra-abdominal and pelvic swelling, mass and lump, unspecified site: Secondary | ICD-10-CM

## 2023-11-22 DIAGNOSIS — R918 Other nonspecific abnormal finding of lung field: Secondary | ICD-10-CM

## 2023-12-01 ENCOUNTER — Ambulatory Visit
Admission: RE | Admit: 2023-12-01 | Discharge: 2023-12-01 | Disposition: A | Discharge status: Home / Self Care | Source: Ambulatory Visit | Attending: Internal Medicine | Admitting: Internal Medicine

## 2023-12-01 DIAGNOSIS — R1900 Intra-abdominal and pelvic swelling, mass and lump, unspecified site: Secondary | ICD-10-CM | POA: Diagnosis present

## 2023-12-01 DIAGNOSIS — R918 Other nonspecific abnormal finding of lung field: Secondary | ICD-10-CM | POA: Diagnosis present

## 2023-12-01 MED ORDER — IOHEXOL 240 MG/ML SOLN
50.0000 mL | Freq: Once | INTRAMUSCULAR | Status: AC | PRN
  Administered 2023-12-01: 50 mL via ORAL

## 2023-12-01 MED ORDER — IOHEXOL 300 MG/ML  SOLN
100.0000 mL | Freq: Once | INTRAMUSCULAR | Status: AC | PRN
  Administered 2023-12-01: 100 mL via INTRAVENOUS

## 2024-01-25 ENCOUNTER — Other Ambulatory Visit: Payer: Self-pay | Admitting: Urology

## 2024-04-28 NOTE — Progress Notes (Signed)
 COVID Vaccine received:  []  No []  Yes Date of any COVID positive Test in last 90 days:  PCP - Dr. Oneil Pinal Cardiologist -  Selinda Manna MD [facility]  Chest x-ray - CT chest 11/02/23 Epic EKG -   Stress Test -  ECHO - 02/03/2018 Epic Cardiac Cath -   Bowel Prep - []  No  []   Yes ______  Pacemaker / ICD device []  No []  Yes   Spinal Cord Stimulator:[]  No []  Yes       History of Sleep Apnea? []  No []  Yes   CPAP used?- []  No []  Yes    Does the patient monitor blood sugar?          []  No []  Yes  []  N/A  Patient has: []  NO Hx DM   []  Pre-DM                 []  DM1  []   DM2 Does patient have a [employer] or Dexacom? []  No []  Yes   Fasting Blood Sugar Ranges-  Checks Blood Sugar _____ times a day  GLP1 agonist / usual dose -  GLP1 instructions:  SGLT-2 inhibitors / usual dose -  SGLT-2 instructions:   Blood Thinner / Instructions: Aspirin  Instructions:  Comments:   Activity level: Patient is able / unable to climb a flight of stairs without difficulty; []  No CP  []  No SOB, but would have ___   Patient can / can not perform ADLs without assistance.   Anesthesia review:   Patient denies shortness of breath, fever, cough and chest pain at PAT appointment.  Patient verbalized understanding and agreement to the Pre-Surgical Instructions that were given to them at this PAT appointment. Patient was also educated of the need to review these PAT instructions again prior to his/her surgery.I reviewed the appropriate phone numbers to call if they have any and questions or concerns.

## 2024-04-28 NOTE — Patient Instructions (Signed)
 SURGICAL WAITING ROOM VISITATION  Patients having surgery or a procedure may have no more than 2 support people in the waiting area - these visitors may rotate.    Children under the age of 75 must have an adult with them who is not the patient.  Visitors with respiratory illnesses are discouraged from visiting and should remain at home.  If the patient needs to stay at the hospital during part of their recovery, the visitor guidelines for inpatient rooms apply. Pre-op nurse will coordinate an appropriate time for 1 support person to accompany patient in pre-op.  This support person may not rotate.    Please refer to the [facility] website for the visitor guidelines for Inpatients (after your surgery is over and you are in a regular room).       Your procedure is scheduled on: 05/17/24   Report to [facility] Main Entrance    Report to admitting at 6:15 AM   Call this number if you have problems the morning of surgery [phone]   Do not eat food or drink liquids :After Midnight. But may have sips of water to take meds.     FOLLOW BOWEL PREP AND ANY ADDITIONAL PRE OP INSTRUCTIONS YOU RECEIVED FROM YOUR SURGEON'S OFFICE!!!     Oral Hygiene is also important to reduce your risk of infection.                                    Remember - BRUSH YOUR TEETH THE MORNING OF SURGERY WITH YOUR REGULAR TOOTHPASTE  DENTURES WILL BE REMOVED PRIOR TO SURGERY PLEASE DO NOT APPLY Poly grip OR ADHESIVES!!!   Stop all vitamins and herbal supplements 7 days before surgery.   Take these medicines the morning of surgery with A SIP OF WATER: Avodart , Pantoprazole , Simvastatin .             You may not have any metal on your body including hair pins, jewelry, and body piercing             Do not wear make-up, lotions, powders, perfumes/cologne, or deodorant              Men may shave face and neck.   Do not bring valuables to the hospital. [place] IS NOT             RESPONSIBLE    FOR VALUABLES.   Contacts, glasses, dentures or bridgework may not be worn into surgery.   Bring small overnight bag day of surgery.   DO NOT BRING YOUR HOME MEDICATIONS TO THE HOSPITAL. PHARMACY WILL DISPENSE MEDICATIONS LISTED ON YOUR MEDICATION LIST TO YOU DURING YOUR ADMISSION IN THE HOSPITAL!    Patients discharged on the day of surgery will not be allowed to drive home.  Someone NEEDS to stay with you for the first 24 hours after anesthesia.   Special Instructions: Bring a copy of your healthcare power of attorney and living will documents the day of surgery if you haven't scanned them before.              Please read over the following fact sheets you were given: IF YOU HAVE QUESTIONS ABOUT YOUR PRE-OP INSTRUCTIONS PLEASE CALL [phone] [name]   If you received a COVID test during your pre-op visit  it is requested that you wear a mask when out in public, stay away from anyone that may not be  feeling well and notify your surgeon if you develop symptoms. If you test positive for Covid or have been in contact with anyone that has tested positive in the last 10 days please notify you surgeon.    [place] - Preparing for Surgery Before surgery, you can play an important role.  Because skin is not sterile, your skin needs to be as free of germs as possible.  You can reduce the number of germs on your skin by washing with CHG (chlorahexidine gluconate) soap before surgery.  CHG is an antiseptic cleaner which kills germs and bonds with the skin to continue killing germs even after washing. Please DO NOT use if you have an allergy to CHG or antibacterial soaps.  If your skin becomes reddened/irritated stop using the CHG and inform your nurse when you arrive at Short Stay. Do not shave (including legs and underarms) for at least 48 hours prior to the first CHG shower.  You may shave your face/neck.  Please follow these instructions carefully:  1.  Shower with CHG Soap the night before  surgery and the  morning of surgery.  2.  If you choose to wash your hair, wash your hair first as usual with your normal  shampoo.  3.  After you shampoo, rinse your hair and body thoroughly to remove the shampoo.                             4.  Use CHG as you would any other liquid soap.  You can apply chg directly to the skin and wash.  Gently with a scrungie or clean washcloth.  5.  Apply the CHG Soap to your body ONLY FROM THE NECK DOWN.   Do   not use on face/ open                           Wound or open sores. Avoid contact with eyes, ears mouth and   genitals (private parts).                       Wash face,  Genitals (private parts) with your normal soap.             6.  Wash thoroughly, paying special attention to the area where your    surgery  will be performed.  7.  Thoroughly rinse your body with warm water from the neck down.  8.  DO NOT shower/wash with your normal soap after using and rinsing off the CHG Soap.                9.  Pat yourself dry with a clean towel.            10.  Wear clean pajamas.            11.  Place clean sheets on your bed the night of your first shower and do not  sleep with pets. Day of Surgery : Do not apply any lotions/deodorants the morning of surgery.  Please wear clean clothes to the hospital/surgery center.  FAILURE TO FOLLOW THESE INSTRUCTIONS MAY RESULT IN THE CANCELLATION OF YOUR SURGERY   ________________________________________________________________________ WHAT IS A BLOOD TRANSFUSION? Blood Transfusion Information  A transfusion is the replacement of blood or some of its parts. Blood is made up of multiple cells which provide different functions. Red blood cells carry oxygen and  are used for blood loss replacement. White blood cells fight against infection. Platelets control bleeding. Plasma helps clot blood. Other blood products are available for specialized needs, such as hemophilia or other clotting disorders. BEFORE THE  TRANSFUSION  Who gives blood for transfusions?  Healthy volunteers who are fully evaluated to make sure their blood is safe. This is blood bank blood. Transfusion therapy is the safest it has ever been in the practice of medicine. Before blood is taken from a donor, a complete history is taken to make sure that person has no history of diseases nor engages in risky social behavior (examples are intravenous drug use or sexual activity with multiple partners). The donor's travel history is screened to minimize risk of transmitting infections, such as malaria. The donated blood is tested for signs of infectious diseases, such as HIV and hepatitis. The blood is then tested to be sure it is compatible with you in order to minimize the chance of a transfusion reaction. If you or a relative donates blood, this is often done in anticipation of surgery and is not appropriate for emergency situations. It takes many days to process the donated blood. RISKS AND COMPLICATIONS Although transfusion therapy is very safe and saves many lives, the main dangers of transfusion include:  Getting an infectious disease. Developing a transfusion reaction. This is an allergic reaction to something in the blood you were given. Every precaution is taken to prevent this. The decision to have a blood transfusion has been considered carefully by your caregiver before blood is given. Blood is not given unless the benefits outweigh the risks. AFTER THE TRANSFUSION Right after receiving a blood transfusion, you will usually feel much better and more energetic. This is especially true if your red blood cells have gotten low (anemic). The transfusion raises the level of the red blood cells which carry oxygen, and this usually causes an energy increase. The nurse administering the transfusion will monitor you carefully for complications. HOME CARE INSTRUCTIONS  No special instructions are needed after a transfusion. You may find your  energy is better. Speak with your caregiver about any limitations on activity for underlying diseases you may have. SEEK MEDICAL CARE IF:  Your condition is not improving after your transfusion. You develop redness or irritation at the intravenous (IV) site. SEEK IMMEDIATE MEDICAL CARE IF:  Any of the following symptoms occur over the next 12 hours: Shaking chills. You have a temperature by mouth above 102 F (38.9 C), not controlled by medicine. Chest, back, or muscle pain. People around you feel you are not acting correctly or are confused. Shortness of breath or difficulty breathing. Dizziness and fainting. You get a rash or develop hives. You have a decrease in urine output. Your urine turns a dark color or changes to pink, red, or brown. Any of the following symptoms occur over the next 10 days: You have a temperature by mouth above 102 F (38.9 C), not controlled by medicine. Shortness of breath. Weakness after normal activity. The white part of the eye turns yellow (jaundice). You have a decrease in the amount of urine or are urinating less often. Your urine turns a dark color or changes to pink, red, or brown.

## 2024-05-02 ENCOUNTER — Encounter (HOSPITAL_COMMUNITY)
Admission: RE | Admit: 2024-05-02 | Discharge: 2024-05-02 | Disposition: A | Discharge status: Home / Self Care | Source: Ambulatory Visit | Attending: Urology | Admitting: Urology

## 2024-05-02 DIAGNOSIS — Z01818 Encounter for other preprocedural examination: Secondary | ICD-10-CM

## 2024-05-05 NOTE — Patient Instructions (Addendum)
 SURGICAL WAITING ROOM VISITATION  Patients having surgery or a procedure may have no more than 2 support people in the waiting area - these visitors may rotate.    Children under the age of 29 must have an adult with them who is not the patient.  Visitors with respiratory illnesses are discouraged from visiting and should remain at home.  If the patient needs to stay at the hospital during part of their recovery, the visitor guidelines for inpatient rooms apply. Pre-op nurse will coordinate an appropriate time for 1 support person to accompany patient in pre-op.  This support person may not rotate.    Please refer to the [facility] website for the visitor guidelines for Inpatients (after your surgery is over and you are in a regular room).       Your procedure is scheduled on: 05/17/24   Report to [facility] Main Entrance    Report to admitting at 6:30  AM   Call this number if you have problems the morning of surgery [phone]   Do not eat food or drink liquids :After Midnight. But may have sips of water to take meds.  Metoprolol     FOLLOW BOWEL PREP AND ANY ADDITIONAL PRE OP INSTRUCTIONS YOU RECEIVED FROM YOUR SURGEON'S OFFICE!!!   ONE   8OZ  BOTTLE OF MAGNESIUM CITRATE AT NOON THE DAY BEFORE SURGERY   Oral Hygiene is also important to reduce your risk of infection.                                    Remember - BRUSH YOUR TEETH THE MORNING OF SURGERY WITH YOUR REGULAR TOOTHPASTE  DENTURES WILL BE REMOVED PRIOR TO SURGERY PLEASE DO NOT APPLY Poly grip OR ADHESIVES!!!   Stop all vitamins and herbal supplements 7 days before surgery.   Take these medicines the morning of surgery with A SIP OF WATER: metoprolol, Simvastatin .             You may not have any metal on your body including hair pins, jewelry, and body piercing             Do not wear , lotions, powders, cologne, or deodorant              Men may shave face and neck.   Do not bring valuables to  the hospital. [name] IS NOT             RESPONSIBLE   FOR VALUABLES.   Contacts, glasses, dentures or bridgework may not be worn into surgery.   Bring small overnight bag day of surgery.   DO NOT BRING YOUR HOME MEDICATIONS TO THE HOSPITAL. PHARMACY WILL DISPENSE MEDICATIONS LISTED ON YOUR MEDICATION LIST TO YOU DURING YOUR ADMISSION IN THE HOSPITAL!    Patients discharged on the day of surgery will not be allowed to drive home.  Someone NEEDS to stay with you for the first 24 hours after anesthesia.   Special Instructions: Bring a copy of your healthcare power of attorney and living will documents the day of surgery if you haven't scanned them before.              Please read over the following fact sheets you were given: IF YOU HAVE QUESTIONS ABOUT YOUR PRE-OP INSTRUCTIONS PLEASE CALL [phone] [place]    If you test positive for Covid or have been in contact with anyone  that has tested positive in the last 10 days please notify you surgeon.    [place] - Preparing for Surgery Before surgery, you can play an important role.  Because skin is not sterile, your skin needs to be as free of germs as possible.  You can reduce the number of germs on your skin by washing with CHG (chlorahexidine gluconate) soap before surgery.  CHG is an antiseptic cleaner which kills germs and bonds with the skin to continue killing germs even after washing. Please DO NOT use if you have an allergy to CHG or antibacterial soaps.  If your skin becomes reddened/irritated stop using the CHG and inform your nurse when you arrive at Short Stay. Do not shave (including legs and underarms) for at least 48 hours prior to the first CHG shower.  You may shave your face/neck.  Please follow these instructions carefully:  1.  Shower with CHG Soap the night before surgery and the  morning of surgery.  2.  If you choose to wash your hair, wash your hair first as usual with your normal  shampoo.  3.  After you shampoo,  rinse your hair and body thoroughly to remove the shampoo.                             4.  Use CHG as you would any other liquid soap.  You can apply chg directly to the skin and wash.  Gently with a scrungie or clean washcloth.  5.  Apply the CHG Soap to your body ONLY FROM THE NECK DOWN.   Do not use on face/ open                           Wound or open sores. Avoid contact with eyes, ears mouth and genitals (private parts).                       Wash face,  Genitals (private parts) with your normal soap.             6.  Wash thoroughly, paying special attention to the area where your  surgery  will be performed.  7.  Thoroughly rinse your body with warm water from the neck down.  8.  DO NOT shower/wash with your normal soap after using and rinsing off the CHG Soap.                9.  Pat yourself dry with a clean towel.            10.  Wear clean pajamas.            11.  Place clean sheets on your bed the night of your first shower and do not  sleep with pets. Day of Surgery : Do not apply any lotions/deodorants the morning of surgery.  Please wear clean clothes to the hospital/surgery center.  FAILURE TO FOLLOW THESE INSTRUCTIONS MAY RESULT IN THE CANCELLATION OF YOUR SURGERY   ________________________________________________________________________ WHAT IS A BLOOD TRANSFUSION? Blood Transfusion Information  A transfusion is the replacement of blood or some of its parts. Blood is made up of multiple cells which provide different functions. Red blood cells carry oxygen and are used for blood loss replacement. White blood cells fight against infection. Platelets control bleeding. Plasma helps clot blood. Other blood products are available for specialized needs, such as  hemophilia or other clotting disorders. BEFORE THE TRANSFUSION  Who gives blood for transfusions?  Healthy volunteers who are fully evaluated to make sure their blood is safe. This is blood bank blood. Transfusion  therapy is the safest it has ever been in the practice of medicine. Before blood is taken from a donor, a complete history is taken to make sure that person has no history of diseases nor engages in risky social behavior (examples are intravenous drug use or sexual activity with multiple partners). The donor's travel history is screened to minimize risk of transmitting infections, such as malaria. The donated blood is tested for signs of infectious diseases, such as HIV and hepatitis. The blood is then tested to be sure it is compatible with you in order to minimize the chance of a transfusion reaction. If you or a relative donates blood, this is often done in anticipation of surgery and is not appropriate for emergency situations. It takes many days to process the donated blood. RISKS AND COMPLICATIONS Although transfusion therapy is very safe and saves many lives, the main dangers of transfusion include:  Getting an infectious disease. Developing a transfusion reaction. This is an allergic reaction to something in the blood you were given. Every precaution is taken to prevent this. The decision to have a blood transfusion has been considered carefully by your caregiver before blood is given. Blood is not given unless the benefits outweigh the risks. AFTER THE TRANSFUSION Right after receiving a blood transfusion, you will usually feel much better and more energetic. This is especially true if your red blood cells have gotten low (anemic). The transfusion raises the level of the red blood cells which carry oxygen, and this usually causes an energy increase. The nurse administering the transfusion will monitor you carefully for complications. HOME CARE INSTRUCTIONS  No special instructions are needed after a transfusion. You may find your energy is better. Speak with your caregiver about any limitations on activity for underlying diseases you may have. SEEK MEDICAL CARE IF:  Your condition is not  improving after your transfusion. You develop redness or irritation at the intravenous (IV) site. SEEK IMMEDIATE MEDICAL CARE IF:  Any of the following symptoms occur over the next 12 hours: Shaking chills. You have a temperature by mouth above 102 F (38.9 C), not controlled by medicine. Chest, back, or muscle pain. People around you feel you are not acting correctly or are confused. Shortness of breath or difficulty breathing. Dizziness and fainting. You get a rash or develop hives. You have a decrease in urine output. Your urine turns a dark color or changes to pink, red, or brown. Any of the following symptoms occur over the next 10 days: You have a temperature by mouth above 102 F (38.9 C), not controlled by medicine. Shortness of breath. Weakness after normal activity. The white part of the eye turns yellow (jaundice). You have a decrease in the amount of urine or are urinating less often. Your urine turns a dark color or changes to pink, red, or brown.

## 2024-05-10 ENCOUNTER — Encounter (HOSPITAL_COMMUNITY): Payer: Self-pay

## 2024-05-10 ENCOUNTER — Encounter (HOSPITAL_COMMUNITY)
Admission: RE | Admit: 2024-05-10 | Discharge: 2024-05-10 | Disposition: A | Discharge status: Home / Self Care | Source: Ambulatory Visit | Attending: Urology | Admitting: Urology

## 2024-05-10 ENCOUNTER — Other Ambulatory Visit: Payer: Self-pay

## 2024-05-10 DIAGNOSIS — Z87891 Personal history of nicotine dependence: Secondary | ICD-10-CM | POA: Diagnosis not present

## 2024-05-10 DIAGNOSIS — J432 Centrilobular emphysema: Secondary | ICD-10-CM | POA: Insufficient documentation

## 2024-05-10 DIAGNOSIS — Z01818 Encounter for other preprocedural examination: Secondary | ICD-10-CM

## 2024-05-10 DIAGNOSIS — R918 Other nonspecific abnormal finding of lung field: Secondary | ICD-10-CM | POA: Diagnosis not present

## 2024-05-10 DIAGNOSIS — N401 Enlarged prostate with lower urinary tract symptoms: Secondary | ICD-10-CM | POA: Insufficient documentation

## 2024-05-10 DIAGNOSIS — Z01812 Encounter for preprocedural laboratory examination: Secondary | ICD-10-CM | POA: Diagnosis present

## 2024-05-10 DIAGNOSIS — R338 Other retention of urine: Secondary | ICD-10-CM | POA: Diagnosis not present

## 2024-05-10 HISTORY — DX: Malignant (primary) neoplasm, unspecified: C80.1

## 2024-05-10 HISTORY — DX: Headache, unspecified: R51.9

## 2024-05-10 HISTORY — DX: Chronic obstructive pulmonary disease, unspecified: J44.9

## 2024-05-10 LAB — BASIC METABOLIC PANEL WITH GFR
Anion gap: 12 (ref 5–15)
BUN: 24 mg/dL — ABNORMAL HIGH (ref 8–23)
CO2: 23 mmol/L (ref 22–32)
Calcium: 9.6 mg/dL (ref 8.9–10.3)
Chloride: 104 mmol/L (ref 98–111)
Creatinine, Ser: 1.09 mg/dL (ref 0.61–1.24)
GFR, Estimated: >60 mL/min (ref >60)
Glucose, Bld: 94 mg/dL (ref 70–99)
Potassium: 4.5 mmol/L (ref 3.5–5.1)
Sodium: 139 mmol/L (ref 135–145)

## 2024-05-10 LAB — CBC
HCT: 47.6 % (ref 39.0–52.0)
Hemoglobin: 15.7 g/dL (ref 13.0–17.0)
MCH: 30.5 pg (ref 26.0–34.0)
MCHC: 33 g/dL (ref 30.0–36.0)
MCV: 92.6 fL (ref 80.0–100.0)
Platelets: 266 K/uL (ref 150–400)
RBC: 5.14 MIL/uL (ref 4.22–5.81)
RDW: 13.4 % (ref 11.5–15.5)
WBC: 11 K/uL — ABNORMAL HIGH (ref 4.0–10.5)
nRBC: 0 % (ref 0.0–0.2)

## 2024-05-10 NOTE — Progress Notes (Addendum)
 COVID Vaccine received:  []  No [x]  Yes Date of any COVID positive Test in last 90 days: 05-01-24  PCP - Dr. Oneil Pinal Cardiologist - no Selinda Manna MD [facility]  Chest x-ray - CT chest 11/02/23 Epic EKG -   Stress Test -  ECHO - 02/03/2018 Epic Cardiac Cath -   Bowel Prep - []  No  []   Yes ______  Pacemaker / ICD device [x]  No []  Yes   Spinal Cord Stimulator:[x]  No []  Yes       History of Sleep Apnea? [x]  No []  Yes   CPAP used?- [x]  No []  Yes    Does the patient monitor blood sugar?          []  No []  Yes  [x]  N/A  Patient has: [x]  NO Hx DM   []  Pre-DM                 []  DM1  []   DM2 Does patient have a [employer] or Dexacom? []  No [x]  Yes   Fasting Blood Sugar Ranges-  Checks Blood Sugar __n/a___ times a day  GLP1 agonist / usual dose - n?a GLP1 instructions:  SGLT-2 inhibitors / usual dose - N/A SGLT-2 instructions:   Blood Thinner / Instructions:n/a Aspirin  Instructions:n/a  Comments:   Activity level: Patient is able / unable to climb a flight of stairs without difficulty; [x]  No CP  [x]  No SOB, but would have ___   Patient can  perform ADLs without assistance.   Anesthesia review: Covid + 05-01-24 asymptomatic at preop 05-10-24, mild emphysema  Patient denies shortness of breath, fever, cough and chest pain at PAT appointment.  Patient verbalized understanding and agreement to the Pre-Surgical Instructions that were given to them at this PAT appointment. Patient was also educated of the need to review these PAT instructions again prior to his/her surgery.I reviewed the appropriate phone numbers to call if they have any and questions or concerns.

## 2024-05-11 ENCOUNTER — Encounter (HOSPITAL_COMMUNITY): Payer: Self-pay

## 2024-05-11 NOTE — Anesthesia Preprocedure Evaluation (Addendum)
 Anesthesia Evaluation  Patient identified by MRN, date of birth, ID band Patient awake    Reviewed: Allergy & Precautions, NPO status , Patient's Chart, lab work & pertinent test results, reviewed documented beta blocker date and time   Airway Mallampati: II  TM Distance: >3 FB Neck ROM: Full    Dental  (+) Implants, Poor Dentition, Dental Advisory Given, Caps   Pulmonary COPD,  COPD inhaler, Recent URI , former smoker Hx/o Covid 2-3 weeks ago mild Sx -resolved   Pulmonary exam normal breath sounds clear to auscultation       Cardiovascular hypertension, Pt. on medications and Pt. on home beta blockers Normal cardiovascular exam Rhythm:Regular Rate:Normal     Neuro/Psych  Headaches    GI/Hepatic Neg liver ROS,GERD  Medicated,,Hx/o Barrett's esophagus   Endo/Other  Hyperlipidemia  Renal/GU negative Renal ROS   BPH with urinary retention    Musculoskeletal negative musculoskeletal ROS (+)    Abdominal   Peds  Hematology negative hematology ROS (+)   Anesthesia Other Findings   Reproductive/Obstetrics                              Anesthesia Physical Anesthesia Plan  ASA: 2  Anesthesia Plan: General   Post-op Pain Management: Minimal or no pain anticipated and Ofirmev  IV (intra-op)*   Induction: Intravenous  PONV Risk Score and Plan: 4 or greater and Treatment may vary due to age or medical condition, Ondansetron  and Dexamethasone   Airway Management Planned: Oral ETT  Additional Equipment: None  Intra-op Plan:   Post-operative Plan: Extubation in OR  Informed Consent: I have reviewed the patients History and Physical, chart, labs and discussed the procedure including the risks, benefits and alternatives for the proposed anesthesia with the patient or authorized representative who has indicated his/her understanding and acceptance.     Dental advisory given  Plan Discussed  with: Anesthesiologist and CRNA  Anesthesia Plan Comments: (See PAT note from 9/10)         Anesthesia Quick Evaluation

## 2024-05-11 NOTE — Progress Notes (Signed)
 Case: [id number] Date/Time: 05/17/24 0815   Procedure: PROSTATECTOMY, SIMPLE, ROBOT-ASSISTED   Anesthesia type: General   Diagnosis: Enlarged prostate with urinary retention [N40.1, R33.8]   Pre-op diagnosis: VERY LARGE PROSTATE, URINARY RETENTION   Location: [facility] ROOM 03 / [facility]   Surgeons: Alvaro Ricardo KATHEE Mickey., MD       DISCUSSION: Billy Moss is an 83 yo male with PMH of former smoking, COPD, pulmonary nodules, GERD, migraines.  Seen by PCP on 03/31/24 for pre op clearance. Cleared as low risk:  Severe BPH-low surgical risk, surgery 9/17 Prostatitis-current asymptomatic, Cipro on hand in case there were recurrent symptoms and then patient will let me know PVCs-controlled   Follow-up end of September postop  Patient [facility] for COPD which is mild per notes. Last seen in clinic on 8/8 and treated for COPD exacerbation with steroids, abx, inhalers. Advised f/u in 3 months.  Diagnosed with COVID on 9/1. No symptoms at PAT visit on 9/10. Specifically no CP/SOB with activity. Anticipate he can proceed.   VS: BP 101/76   Pulse 100   Temp 36.7 C (Oral)   Resp 16   Ht 6' (1.829 m)   Wt 91.2 kg   SpO2 97%   BMI 27.26 kg/m   PROVIDERS: Cleotilde Oneil FALCON, MD   LABS: Labs reviewed: Acceptable for surgery. (all labs ordered are listed, but only abnormal results are displayed)  Labs Reviewed  BASIC METABOLIC PANEL WITH GFR - Abnormal; Notable for the following components:      Result Value   BUN 24 (*)    All other components within normal limits  CBC - Abnormal; Notable for the following components:   WBC 11.0 (*)    All other components within normal limits  TYPE AND SCREEN     IMAGES:  SPIROMETRY and PFTs 04/07/24 ([facility]):  FVC was 4.63 L, 116 % of predicted FEV1 was 3.50 L, 120 % of predicted FEV1/FVC ratio was  103 % of predicted FEF 25-75% liters per second was 139 % of predicted  LUNG VOLUMES: TLC was 90 % of predicted RV was 56 % of  predicted 2 efforts within 5%  DIFFUSION CAPACITY: DLCO was 79 % of predicted DLCO/VA was 86 % of predicted  CT Chest 11/02/23:  IMPRESSION: *Mild centrilobular emphysematous changes with subpleural bullous and blebs. *Multiple pulmonary nodules. Most severe: 5 mm right solid pulmonary nodule. Lung-RADS 3, probably benign findings. Short-term follow-up in 6 months is recommended with repeat low-dose chest CT without contrast (please use the following order, CT CHEST LCS NODULE FOLLOW-UP W/O CM). *Cystic appearing lesion that projects within the mesentery measuring 5.8 x 4.6 cm. Etiology significance unknown based on these images and correlation with a complete CT abdomen and pelvis with IV and oral contrast is recommended. *Cyst in the upper pole of the left kidney measures 4.4 x 3.3 cm Bosniak 1, No follow-up imaging is recommended.  EKG:   CV: Echo 02/03/2018:  Study Conclusions  - Left ventricle: The cavity size was normal. Systolic function was   normal. The estimated ejection fraction was 65%. Wall motion was   normal; there were no regional wall motion abnormalities. Doppler   parameters are consistent with abnormal left ventricular   relaxation (grade 1 diastolic dysfunction). - Aortic valve: There was mild regurgitation. Valve area (VTI):   2.36 cm^2. Valve area (Vmax): 2.36 cm^2. Valve area (Vmean): 2.29   cm^2. - Mitral valve: There was mild regurgitation. Valve area by  continuity equation (using LVOT flow): 4.26 cm^2. - Pulmonary arteries: PA peak pressure: 34 mm Hg (S).  Impressions:  - The right ventricular systolic pressure was increased consistent   with mild pulmonary hypertension. Normal LVEF, mild diastolic   dysfunction, mild MR/AR. 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    Benign tubular adenoma of large intestine    BPH (benign prostatic hyperplasia)    Cancer (HCC)    skin cancer  melanoma  and basal  cell   COPD (chronic obstructive pulmonary disease) (HCC)    mild emphysema   GERD (gastroesophageal reflux disease)    Headache    occular migraines   once every 6 months   Hyperlipidemia    Vitamin B 12 deficiency     Past Surgical History:  Procedure Laterality Date   BACK SURGERY  1982   LAMINECTOMY LUMBAR SPINE   COLONOSCOPY  03/15/2012   COLONOSCOPY WITH PROPOFOL  N/A 06/23/2017   Procedure: COLONOSCOPY WITH PROPOFOL ;  Surgeon: Viktoria Lamar DASEN, MD;  Location: [facility] ENDOSCOPY;  Service: Endoscopy;  Laterality: N/A;   COLONOSCOPY WITH PROPOFOL  N/A 09/07/2018   Procedure: COLONOSCOPY WITH PROPOFOL ;  Surgeon: Viktoria Lamar DASEN, MD;  Location: [facility] ENDOSCOPY;  Service: Endoscopy;  Laterality: N/A;   ESOPHAGOGASTRODUODENOSCOPY (EGD) WITH PROPOFOL  N/A 06/23/2017   Procedure: ESOPHAGOGASTRODUODENOSCOPY (EGD) WITH PROPOFOL ;  Surgeon: Viktoria Lamar DASEN, MD;  Location: [facility] ENDOSCOPY;  Service: Endoscopy;  Laterality: N/A;   lamenectomy     MOHS SURGERY     PROSTATE BIOPSY  1999   VASECTOMY  1971   WRIST SURGERY Right    fracture surgery   mid to late 80's    MEDICATIONS:  ascorbic acid (VITAMIN C) 500 MG tablet   Cholecalciferol (VITAMIN D-3 PO)   dutasteride  (AVODART ) 0.5 MG capsule   ibuprofen (ADVIL,MOTRIN) 200 MG tablet   metoprolol succinate (TOPROL-XL) 25 MG 24 hr tablet   Multiple Vitamins-Minerals (MENS 50+ MULTI VITAMIN/MIN) TABS   simvastatin  (ZOCOR ) 20 MG tablet   vitamin B-12 (CYANOCOBALAMIN) 1000 MCG tablet   No current facility-administered medications for this encounter.   Burnard CHRISTELLA Odis DEVONNA [facility] Short Stay/Anesthesiology [facility] Phone [phone] 05/11/2024 10:55 AM

## 2024-05-17 ENCOUNTER — Ambulatory Visit (HOSPITAL_COMMUNITY): Payer: Self-pay | Admitting: Physician Assistant

## 2024-05-17 ENCOUNTER — Other Ambulatory Visit: Payer: Self-pay

## 2024-05-17 ENCOUNTER — Ambulatory Visit (HOSPITAL_COMMUNITY): Admitting: Registered Nurse

## 2024-05-17 ENCOUNTER — Observation Stay (HOSPITAL_COMMUNITY)
Admission: RE | Admit: 2024-05-17 | Discharge: 2024-05-18 | Disposition: A | Discharge status: Home / Self Care | Attending: Urology | Admitting: Urology

## 2024-05-17 ENCOUNTER — Encounter (HOSPITAL_COMMUNITY)
Admission: RE | Disposition: A | Discharge status: Home / Self Care | Payer: Self-pay | Source: Home / Self Care | Attending: Urology

## 2024-05-17 ENCOUNTER — Encounter (HOSPITAL_COMMUNITY): Payer: Self-pay | Admitting: Urology

## 2024-05-17 DIAGNOSIS — Z87891 Personal history of nicotine dependence: Secondary | ICD-10-CM | POA: Diagnosis not present

## 2024-05-17 DIAGNOSIS — Z79899 Other long term (current) drug therapy: Secondary | ICD-10-CM | POA: Insufficient documentation

## 2024-05-17 DIAGNOSIS — R339 Retention of urine, unspecified: Secondary | ICD-10-CM | POA: Insufficient documentation

## 2024-05-17 DIAGNOSIS — N401 Enlarged prostate with lower urinary tract symptoms: Principal | ICD-10-CM | POA: Insufficient documentation

## 2024-05-17 DIAGNOSIS — J449 Chronic obstructive pulmonary disease, unspecified: Secondary | ICD-10-CM | POA: Diagnosis not present

## 2024-05-17 DIAGNOSIS — I10 Essential (primary) hypertension: Secondary | ICD-10-CM | POA: Diagnosis not present

## 2024-05-17 DIAGNOSIS — F1090 Alcohol use, unspecified, uncomplicated: Secondary | ICD-10-CM | POA: Diagnosis not present

## 2024-05-17 DIAGNOSIS — N138 Other obstructive and reflux uropathy: Principal | ICD-10-CM | POA: Diagnosis present

## 2024-05-17 HISTORY — PX: XI ROBOTIC ASSISTED SIMPLE PROSTATECTOMY: SHX6713

## 2024-05-17 LAB — TYPE AND SCREEN
ABO/RH(D): O POS
Antibody Screen: NEGATIVE

## 2024-05-17 LAB — HEMOGLOBIN AND HEMATOCRIT, BLOOD
HCT: 42.7 % (ref 39.0–52.0)
Hemoglobin: 13.6 g/dL (ref 13.0–17.0)

## 2024-05-17 LAB — ABO/RH: ABO/RH(D): O POS

## 2024-05-17 SURGERY — PROSTATECTOMY, SIMPLE, ROBOT-ASSISTED
Anesthesia: General

## 2024-05-17 MED ORDER — DIPHENHYDRAMINE HCL 50 MG/ML IJ SOLN: 12.5000 mg | Freq: Four times a day (QID) | INTRAMUSCULAR | Status: DC | PRN

## 2024-05-17 MED ORDER — PROPOFOL 10 MG/ML IV BOLUS
INTRAVENOUS | Status: DC | PRN
  Administered 2024-05-17: 120 mg via INTRAVENOUS
  Administered 2024-05-17: 50 ug/kg/min via INTRAVENOUS

## 2024-05-17 MED ORDER — HYDROMORPHONE HCL 1 MG/ML IJ SOLN: 0.5000 mg | INTRAMUSCULAR | Status: DC | PRN

## 2024-05-17 MED ORDER — SUGAMMADEX SODIUM 200 MG/2ML IV SOLN
INTRAVENOUS | Status: DC | PRN
  Administered 2024-05-17: 200 mg via INTRAVENOUS

## 2024-05-17 MED ORDER — PHENYLEPHRINE HCL-NACL 20-0.9 MG/250ML-% IV SOLN
INTRAVENOUS | Status: DC | PRN
  Administered 2024-05-17: 50 ug/min via INTRAVENOUS

## 2024-05-17 MED ORDER — DEXAMETHASONE SODIUM PHOSPHATE 10 MG/ML IJ SOLN
INTRAMUSCULAR | Status: DC | PRN
  Administered 2024-05-17: 10 mg via INTRAVENOUS

## 2024-05-17 MED ORDER — SODIUM CHLORIDE 0.9 % IV BOLUS
1000.0000 mL | Freq: Once | INTRAVENOUS | Status: AC
  Administered 2024-05-17: 1000 mL via INTRAVENOUS

## 2024-05-17 MED ORDER — DIPHENHYDRAMINE HCL 12.5 MG/5ML PO ELIX: 12.5000 mg | ORAL_SOLUTION | Freq: Four times a day (QID) | ORAL | Status: DC | PRN

## 2024-05-17 MED ORDER — ONDANSETRON HCL 4 MG/2ML IJ SOLN: 4.0000 mg | INTRAMUSCULAR | Status: DC | PRN

## 2024-05-17 MED ORDER — PHENYLEPHRINE HCL (PRESSORS) 10 MG/ML IV SOLN
INTRAVENOUS | Status: DC | PRN
  Administered 2024-05-17 (×2): 80 ug via INTRAVENOUS

## 2024-05-17 MED ORDER — SODIUM CHLORIDE 0.9 % IV SOLN: INTRAVENOUS | Status: DC

## 2024-05-17 MED ORDER — ONDANSETRON HCL 4 MG/2ML IJ SOLN
INTRAMUSCULAR | Status: DC | PRN
  Administered 2024-05-17: 4 mg via INTRAVENOUS

## 2024-05-17 MED ORDER — ACETAMINOPHEN 500 MG PO TABS
1000.0000 mg | ORAL_TABLET | Freq: Four times a day (QID) | ORAL | Status: DC
  Administered 2024-05-17 – 2024-05-18 (×3): 1000 mg via ORAL
  Filled 2024-05-17 (×3): qty 2

## 2024-05-17 MED ORDER — METOPROLOL SUCCINATE ER 25 MG PO TB24
12.5000 mg | ORAL_TABLET | Freq: Every day | ORAL | Status: DC
  Administered 2024-05-18: 12.5 mg via ORAL
  Filled 2024-05-17: qty 1

## 2024-05-17 MED ORDER — HYOSCYAMINE SULFATE 0.125 MG SL SUBL: 0.1250 mg | SUBLINGUAL_TABLET | SUBLINGUAL | Status: DC | PRN

## 2024-05-17 MED ORDER — DUTASTERIDE 0.5 MG PO CAPS
0.5000 mg | ORAL_CAPSULE | Freq: Every day | ORAL | Status: DC
Start: 2024-05-17 — End: 2024-05-18
  Administered 2024-05-17: 0.5 mg via ORAL
  Filled 2024-05-17: qty 1

## 2024-05-17 MED ORDER — MAGNESIUM CITRATE PO SOLN: 1.0000 | Freq: Once | ORAL | Status: DC

## 2024-05-17 MED ORDER — ROCURONIUM BROMIDE 100 MG/10ML IV SOLN
INTRAVENOUS | Status: DC | PRN
  Administered 2024-05-17: 30 mg via INTRAVENOUS
  Administered 2024-05-17: 70 mg via INTRAVENOUS

## 2024-05-17 MED ORDER — LIDOCAINE HCL (PF) 2 % IJ SOLN
INTRAMUSCULAR | Status: DC | PRN
  Administered 2024-05-17: 80 mg via INTRADERMAL

## 2024-05-17 MED ORDER — STERILE WATER FOR IRRIGATION IR SOLN
Status: DC | PRN
Start: 2024-05-17 — End: 2024-05-17
  Administered 2024-05-17: 1000 mL

## 2024-05-17 MED ORDER — FENTANYL CITRATE (PF) 100 MCG/2ML IJ SOLN
INTRAMUSCULAR | Status: AC
  Filled 2024-05-17: qty 2

## 2024-05-17 MED ORDER — CHLORHEXIDINE GLUCONATE 0.12 % MT SOLN
15.0000 mL | Freq: Once | OROMUCOSAL | Status: AC
  Administered 2024-05-17: 15 mL via OROMUCOSAL

## 2024-05-17 MED ORDER — CEFAZOLIN SODIUM-DEXTROSE 2-4 GM/100ML-% IV SOLN: 2.0000 g | INTRAVENOUS | Status: DC

## 2024-05-17 MED ORDER — BUPIVACAINE LIPOSOME 1.3 % IJ SUSP
INTRAMUSCULAR | Status: AC
  Filled 2024-05-17: qty 20

## 2024-05-17 MED ORDER — SIMVASTATIN 20 MG PO TABS
20.0000 mg | ORAL_TABLET | ORAL | Status: DC
  Administered 2024-05-17: 20 mg via ORAL
  Filled 2024-05-17: qty 1

## 2024-05-17 MED ORDER — CIPROFLOXACIN HCL 500 MG PO TABS: 500.0000 mg | ORAL_TABLET | Freq: Two times a day (BID) | ORAL | 0 refills | Status: AC

## 2024-05-17 MED ORDER — OXYCODONE HCL 5 MG PO TABS: 5.0000 mg | ORAL_TABLET | ORAL | Status: DC | PRN

## 2024-05-17 MED ORDER — HYDROMORPHONE HCL 1 MG/ML IJ SOLN: 0.2500 mg | INTRAMUSCULAR | Status: DC | PRN

## 2024-05-17 MED ORDER — SODIUM CHLORIDE (PF) 0.9 % IJ SOLN
INTRAMUSCULAR | Status: AC
Start: 2024-05-17 — End: 2024-05-17
  Filled 2024-05-17: qty 20

## 2024-05-17 MED ORDER — HYDROMORPHONE HCL 1 MG/ML IJ SOLN
INTRAMUSCULAR | Status: DC | PRN
  Administered 2024-05-17 (×2): 1 mg via INTRAVENOUS

## 2024-05-17 MED ORDER — DOCUSATE SODIUM 100 MG PO CAPS: 100.0000 mg | ORAL_CAPSULE | Freq: Two times a day (BID) | ORAL | Status: AC

## 2024-05-17 MED ORDER — TRIPLE ANTIBIOTIC 3.5-400-5000 EX OINT: 1.0000 | TOPICAL_OINTMENT | Freq: Three times a day (TID) | CUTANEOUS | Status: DC | PRN

## 2024-05-17 MED ORDER — OXYCODONE HCL 5 MG PO TABS: 5.0000 mg | ORAL_TABLET | Freq: Once | ORAL | Status: DC | PRN

## 2024-05-17 MED ORDER — CEFAZOLIN SODIUM-DEXTROSE 2-4 GM/100ML-% IV SOLN
INTRAVENOUS | Status: AC
  Filled 2024-05-17: qty 100

## 2024-05-17 MED ORDER — LACTATED RINGERS IV SOLN: INTRAVENOUS | Status: DC

## 2024-05-17 MED ORDER — ONDANSETRON HCL 4 MG/2ML IJ SOLN: 4.0000 mg | Freq: Once | INTRAMUSCULAR | Status: DC | PRN

## 2024-05-17 MED ORDER — OXYCODONE HCL 5 MG/5ML PO SOLN: 5.0000 mg | Freq: Once | ORAL | Status: DC | PRN

## 2024-05-17 MED ORDER — BUPIVACAINE LIPOSOME 1.3 % IJ SUSP
INTRAMUSCULAR | Status: DC | PRN
  Administered 2024-05-17: 20 mL

## 2024-05-17 MED ORDER — SODIUM CHLORIDE (PF) 0.9 % IJ SOLN
INTRAMUSCULAR | Status: DC | PRN
  Administered 2024-05-17: 20 mL

## 2024-05-17 MED ORDER — ORAL CARE MOUTH RINSE: 15.0000 mL | Freq: Once | OROMUCOSAL | Status: AC

## 2024-05-17 MED ORDER — HYDROCODONE-ACETAMINOPHEN 5-325 MG PO TABS: 1.0000 | ORAL_TABLET | Freq: Four times a day (QID) | ORAL | 0 refills | Status: AC | PRN

## 2024-05-17 MED ORDER — HYDROMORPHONE HCL 2 MG/ML IJ SOLN
INTRAMUSCULAR | Status: AC
  Filled 2024-05-17: qty 1

## 2024-05-17 MED ORDER — FENTANYL CITRATE (PF) 100 MCG/2ML IJ SOLN
INTRAMUSCULAR | Status: DC | PRN
  Administered 2024-05-17: 100 ug via INTRAVENOUS

## 2024-05-17 MED ORDER — AMISULPRIDE (ANTIEMETIC) 5 MG/2ML IV SOLN: 10.0000 mg | Freq: Once | INTRAVENOUS | Status: DC | PRN

## 2024-05-17 MED ORDER — DOCUSATE SODIUM 100 MG PO CAPS
100.0000 mg | ORAL_CAPSULE | Freq: Two times a day (BID) | ORAL | Status: DC
  Administered 2024-05-17 – 2024-05-18 (×2): 100 mg via ORAL
  Filled 2024-05-17 (×2): qty 1

## 2024-05-17 SURGICAL SUPPLY — 54 items
APPLICATOR COTTON TIP 6 STRL (MISCELLANEOUS) ×1 IMPLANT
BAG COUNTER SPONGE SURGICOUNT (BAG) IMPLANT
CATH FOLEY 2WAY SLVR 18FR 30CC (CATHETERS) ×1 IMPLANT
CATH TIEMANN FOLEY 18FR 5CC (CATHETERS) IMPLANT
CATH URTH STD 24FR FL 3W 2 (CATHETERS) ×1 IMPLANT
CHLORAPREP W/TINT 26 (MISCELLANEOUS) ×1 IMPLANT
COVER SURGICAL LIGHT HANDLE (MISCELLANEOUS) ×1 IMPLANT
COVER TIP SHEARS 8 DVNC (MISCELLANEOUS) ×1 IMPLANT
CUTTER ECHEON FLEX ENDO 45 340 (ENDOMECHANICALS) IMPLANT
DERMABOND ADVANCED .7 DNX12 (GAUZE/BANDAGES/DRESSINGS) ×1 IMPLANT
DRAPE ARM DVNC X/XI (DISPOSABLE) ×4 IMPLANT
DRAPE COLUMN DVNC XI (DISPOSABLE) ×1 IMPLANT
DRAPE SURG IRRIG POUCH 19X23 (DRAPES) ×1 IMPLANT
DRIVER NDL LRG 8 DVNC XI (INSTRUMENTS) ×2 IMPLANT
DRIVER NDLE LRG 8 DVNC XI (INSTRUMENTS) ×2 IMPLANT
DRSG TEGADERM 4X4.75 (GAUZE/BANDAGES/DRESSINGS) ×1 IMPLANT
ELECT PENCIL ROCKER SW 15FT (MISCELLANEOUS) ×1 IMPLANT
ELECT REM PT RETURN 15FT ADLT (MISCELLANEOUS) ×1 IMPLANT
FORCEPS BPLR LNG DVNC XI (INSTRUMENTS) ×1 IMPLANT
FORCEPS PROGRASP DVNC XI (FORCEP) ×1 IMPLANT
GAUZE SPONGE 4X4 12PLY STRL (GAUZE/BANDAGES/DRESSINGS) ×1 IMPLANT
GLOVE BIO SURGEON STRL SZ 6.5 (GLOVE) ×1 IMPLANT
GLOVE BIOGEL PI IND STRL 7.5 (GLOVE) IMPLANT
GLOVE SURG LX STRL 7.5 STRW (GLOVE) ×2 IMPLANT
GOWN STRL REUS W/ TWL XL LVL3 (GOWN DISPOSABLE) ×2 IMPLANT
GOWN STRL SURGICAL XL XLNG (GOWN DISPOSABLE) ×1 IMPLANT
HEMOSTAT SURGICEL 4X8 (HEMOSTASIS) IMPLANT
HOLDER FOLEY CATH W/STRAP (MISCELLANEOUS) ×1 IMPLANT
IRRIGATION SUCT STRKRFLW 2 WTP (MISCELLANEOUS) ×1 IMPLANT
IV LACTATED RINGERS 1000ML (IV SOLUTION) ×1 IMPLANT
KIT TURNOVER KIT A (KITS) ×1 IMPLANT
NDL INSUFFLATION 14GA 120MM (NEEDLE) ×1 IMPLANT
NEEDLE INSUFFLATION 14GA 120MM (NEEDLE) ×1 IMPLANT
PACK ROBOT UROLOGY CUSTOM (CUSTOM PROCEDURE TRAY) ×1 IMPLANT
PAD POSITIONING PINK XL (MISCELLANEOUS) ×1 IMPLANT
PORT ACCESS TROCAR AIRSEAL 12 (TROCAR) ×1 IMPLANT
RELOAD STAPLE 45 4.1 GRN THCK (STAPLE) ×1 IMPLANT
SCISSORS LAP 5X45 EPIX DISP (ENDOMECHANICALS) IMPLANT
SCISSORS MNPLR CVD DVNC XI (INSTRUMENTS) ×1 IMPLANT
SEAL UNIV 5-12 XI (MISCELLANEOUS) ×4 IMPLANT
SET TRI-LUMEN FLTR TB AIRSEAL (TUBING) ×1 IMPLANT
SOLUTION ELECTROSURG ANTI STCK (MISCELLANEOUS) ×1 IMPLANT
SPIKE FLUID TRANSFER (MISCELLANEOUS) ×1 IMPLANT
SPONGE T-LAP 4X18 ~~LOC~~+RFID (SPONGE) IMPLANT
SUT ETHILON 3 0 PS 1 (SUTURE) ×1 IMPLANT
SUT MNCRL AB 4-0 PS2 18 (SUTURE) ×2 IMPLANT
SUT PDS AB 1 CT1 27 (SUTURE) ×2 IMPLANT
SUT VIC AB 0 CT1 27XBRD ANTBC (SUTURE) ×3 IMPLANT
SUT VIC AB 2-0 SH 27X BRD (SUTURE) ×1 IMPLANT
SUT VICRYL 0 UR6 27IN ABS (SUTURE) ×1 IMPLANT
SUT VLOC 180 2-0 9IN GS21 (SUTURE) ×2 IMPLANT
SUTURE VLOC BRB 180 ABS3/0GR12 (SUTURE) ×2 IMPLANT
SYSTEM BAG RETRIEVAL 10MM (BASKET) ×1 IMPLANT
WATER STERILE IRR 1000ML POUR (IV SOLUTION) ×1 IMPLANT

## 2024-05-17 NOTE — H&P (Signed)
 Billy Moss is an 83 y.o. male.    Chief Complaint: Pre-OP Robotic Simple PRostatectomy  HPI:   1 - Large Prostate With Urinary Retention - 200gm prostate with moderate medain and >2L residual on CT 2025. ON dutasteride  x years. Catheter placed. UDS with compromised but preserved function with PDet 40s. Cr 1.1 at baseline.   PMH sig for mild COPD/Emphysema (inhalers, No O2, not limiting). No chest/abd surgeries. 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 Wife Billy Moss (met at [employer]) involved. HIs PCP is Billy Pinal MD with [facility].   Today Billy Moss is seen to proceed with simple prostatectomy. NO interval fevers. Most recetn UCX klebsiella colonization Res amp, sens all others. Cr 1.09, Hgb 15 most recently.   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    Benign tubular adenoma of large intestine    BPH (benign prostatic hyperplasia)    Cancer (HCC)    skin cancer  melanoma  and basal cell   COPD (chronic obstructive pulmonary disease) (HCC)    mild emphysema   GERD (gastroesophageal reflux disease)    Headache    occular migraines   once every 6 months   Hyperlipidemia    Vitamin B 12 deficiency     Past Surgical History:  Procedure Laterality Date   BACK SURGERY  1982   LAMINECTOMY LUMBAR SPINE   COLONOSCOPY  03/15/2012   COLONOSCOPY WITH PROPOFOL  N/A 06/23/2017   Procedure: COLONOSCOPY WITH PROPOFOL ;  Surgeon: Billy Lamar DASEN, MD;  Location: [facility] ENDOSCOPY;  Service: Endoscopy;  Laterality: N/A;   COLONOSCOPY WITH PROPOFOL  N/A 09/07/2018   Procedure: COLONOSCOPY WITH PROPOFOL ;  Surgeon: Billy Lamar DASEN, MD;  Location: [facility] ENDOSCOPY;  Service: Endoscopy;  Laterality: N/A;   ESOPHAGOGASTRODUODENOSCOPY (EGD) WITH PROPOFOL  N/A 06/23/2017   Procedure: ESOPHAGOGASTRODUODENOSCOPY (EGD) WITH PROPOFOL ;  Surgeon: Billy Lamar DASEN, MD;  Location[facility] ENDOSCOPY;  Service: Endoscopy;  Laterality: N/A;   lamenectomy     MOHS SURGERY     PROSTATE  BIOPSY  1999   VASECTOMY  1971   WRIST SURGERY Right    fracture surgery   mid to late 80's    No family history on file. Social History:  reports that he quit smoking about 40 years ago. His smoking use included cigarettes. He has never used smokeless tobacco. He reports current alcohol use of about 6.0 standard drinks of alcohol per week. He reports that he does not use drugs.  Allergies:  Allergies  Allergen Reactions   Sudafed [Pseudoephedrine Hcl] Other (See Comments)    Syncope    Sulfa Antibiotics Rash    No medications prior to admission.    No results found for this or any previous visit (from the past 48 hours). No results found.  Review of Systems  Constitutional:  Negative for chills and fever.  Genitourinary:  Positive for difficulty urinating.  All other systems reviewed and are negative.   There were no vitals taken for this visit. Physical Exam Vitals reviewed.  HENT:     Head: Normocephalic.  Eyes:     Pupils: Pupils are equal, round, and reactive to light.  Cardiovascular:     Rate and Rhythm: Normal rate.  Pulmonary:     Effort: Pulmonary effort is normal.  Abdominal:     General: Abdomen is flat.  Genitourinary:    Comments: Catheter in place with non-foul urine Musculoskeletal:        General: Normal range of motion.  Cervical back: Normal range of motion.  Skin:    General: Skin is warm.  Neurological:     General: No focal deficit present.     Mental Status: He is alert.  Psychiatric:        Mood and Affect: Mood normal.      Assessment/Plan  Proceed as planned with simple prostatectomy. Risks, benefits, alternatives, expected peri-op course discussed previously and reiterated today. He understands that his advanced age and pulm comorbidity increases risk of peri-op complicaitons.   Billy Moss., MD 05/17/2024, 6:35 AM

## 2024-05-17 NOTE — Discharge Instructions (Addendum)
 1- Drain Sites - You may have some mild persistent drainage from old drain site for several days, this is normal. This can be covered with cotton gauze for convenience.  2 - Stiches - Your stitches are all dissolvable. You may notice a "loose thread" at your incisions, these are normal and require no intervention. You may cut them flush to the skin with fingernail clippers if needed for comfort.  3 - Diet - No restrictions  4 - Activity - No heavy lifting / straining (any activities that require valsalva or "bearing down") x 4 weeks. Otherwise, no restrictions.  5 - Bathing - You may shower immediately. Do not take a bath or get into swimming pool where incision sites are submersed in water x 4 weeks.   6 - Catheter - Will remain in place until removed at your next appointment. It may be cleaned with soap and water in the shower. It may be disconnected from the drain bag while in the shower to avoid tripping over the tube. You may apply Neosporin or Vaseline ointment as needed to the tip of the penis where the catheter inserts to reduce friction and irritation in this spot.   7 - When to Call the Doctor - Call MD for any fever >102, any acute wound problems, or any severe nausea / vomiting. You can call the [facility] [phone]) 24 hours a day 365 days a year. It will roll-over to the answering service and on-call physician after hours.   You may resume aspirin, advil, aleve, vitamins, and supplements 7 days after surgery.

## 2024-05-17 NOTE — Anesthesia Postprocedure Evaluation (Signed)
 Anesthesia Post Note  Patient: Billy Moss  Procedure(s) Performed: PROSTATECTOMY, SIMPLE, ROBOT-ASSISTED     Patient location during evaluation: PACU Anesthesia Type: General Level of consciousness: awake and alert and oriented Pain management: pain level controlled Vital Signs Assessment: post-procedure vital signs reviewed and stable Respiratory status: spontaneous breathing, nonlabored ventilation and respiratory function stable Cardiovascular status: blood pressure returned to baseline and stable Postop Assessment: no apparent nausea or vomiting Anesthetic complications: no   No notable events documented.  Last Vitals:  Vitals:   05/17/24 1130 05/17/24 1145  BP: 138/84 129/71  Pulse: 79 78  Resp: 10 (!) 23  Temp:    SpO2: 99% 91%    Last Pain:  Vitals:   05/17/24 1145  TempSrc:   PainSc: 0-No pain                 Jered Heiny A.

## 2024-05-17 NOTE — Brief Op Note (Signed)
 05/17/2024  11:12 AM  PATIENT:  Billy Moss  83 y.o. male  PRE-OPERATIVE DIAGNOSIS:  VERY LARGE PROSTATE, URINARY RETENTION  POST-OPERATIVE DIAGNOSIS:  VERY LARGE PROSTATE, URINARY RETENTION  PROCEDURE:  Procedure(s): PROSTATECTOMY, SIMPLE, ROBOT-ASSISTED (N/A)  SURGEON:  Surgeons and Role:    * Manny, Ricardo KATHEE Raddle., MD - Primary  PHYSICIAN ASSISTANT:   ASSISTANTS: Alan Hammonds, PA   ANESTHESIA:   local and general  EBL:  150 mL   BLOOD ADMINISTERED:none  DRAINS: Foley    LOCAL MEDICATIONS USED:  MARCAINE      SPECIMEN:  Source of Specimen:  prostate adenoma  DISPOSITION OF SPECIMEN:  PATHOLOGY  COUNTS:  YES  TOURNIQUET:  * No tourniquets in log *  DICTATION: .Other Dictation: Dictation Number [id number]  PLAN OF CARE: Admit for Observation  PATIENT DISPOSITION:  PACU - hemodynamically stable.   Delay start of Pharmacological VTE agent (>24hrs) due to surgical blood loss or risk of bleeding: yes

## 2024-05-17 NOTE — Transfer of Care (Signed)
 Immediate Anesthesia Transfer of Care Note  Patient: Billy Moss  Procedure(s) Performed: PROSTATECTOMY, SIMPLE, ROBOT-ASSISTED  Patient Location: PACU  Anesthesia Type:General  Level of Consciousness: Drowsy  Airway & Oxygen Therapy: Patient Spontanous Breathing and Patient connected to face mask oxygen  Post-op Assessment: Report given to RN and Post -op Vital signs reviewed and stable  Post vital signs: Reviewed and stable  Last Vitals:  Vitals Value Taken Time  BP 113/79 05/17/24 11:18  Temp    Pulse 83 05/17/24 11:20  Resp 6 05/17/24 11:20  SpO2 100 % 05/17/24 11:20  Vitals shown include unfiled device data.  Last Pain:  Vitals:   05/17/24 0738  TempSrc:   PainSc: 0-No pain         Complications: No notable events documented.

## 2024-05-17 NOTE — Anesthesia Procedure Notes (Signed)
 Procedure Name: Intubation Date/Time: 05/17/2024 8:34 AM  Performed by: Lanning Cena RAMAN, CRNAPre-anesthesia Checklist: Patient identified, Emergency Drugs available, Suction available, Patient being monitored and Timeout performed Patient Re-evaluated:Patient Re-evaluated prior to induction Oxygen Delivery Method: Circle system utilized Preoxygenation: Pre-oxygenation with 100% oxygen Induction Type: IV induction Ventilation: Mask ventilation without difficulty Laryngoscope Size: Glidescope and 3 Grade View: Grade I Tube type: Oral Tube size: 8.0 mm Number of attempts: 1 Airway Equipment and Method: Rigid stylet and Video-laryngoscopy Placement Confirmation: ETT inserted through vocal cords under direct vision, positive ETCO2, CO2 detector and breath sounds checked- equal and bilateral Secured at: 22 cm Tube secured with: Tape Dental Injury: Teeth and Oropharynx as per pre-operative assessment

## 2024-05-17 NOTE — Plan of Care (Signed)
   Problem: Education: Goal: Knowledge of the procedure and recovery process will improve Outcome: Progressing   Problem: Bowel/Gastric: Goal: Gastrointestinal status for postoperative course will improve Outcome: Progressing   Problem: Pain Management: Goal: General experience of comfort will improve Outcome: Progressing   Problem: Skin Integrity: Goal: Demonstration of wound healing without infection will improve Outcome: Progressing   Problem: Urinary Elimination: Goal: Ability to avoid or minimize complications of infection will improve Outcome: Progressing Goal: Ability to achieve and maintain urine output will improve Outcome: Progressing Goal: Home care management will improve Outcome: Progressing   Problem: Education: Goal: Knowledge of General Education information will improve Description: Including pain rating scale, medication(s)/side effects and non-pharmacologic comfort measures Outcome: Progressing   Problem: Health Behavior/Discharge Planning: Goal: Ability to manage health-related needs will improve Outcome: Progressing   Problem: Clinical Measurements: Goal: Ability to maintain clinical measurements within normal limits will improve Outcome: Progressing Goal: Will remain free from infection Outcome: Progressing Goal: Diagnostic test results will improve Outcome: Progressing Goal: Respiratory complications will improve Outcome: Progressing Goal: Cardiovascular complication will be avoided Outcome: Progressing   Problem: Activity: Goal: Risk for activity intolerance will decrease Outcome: Progressing   Problem: Nutrition: Goal: Adequate nutrition will be maintained Outcome: Progressing   Problem: Coping: Goal: Level of anxiety will decrease Outcome: Progressing   Problem: Elimination: Goal: Will not experience complications related to bowel motility Outcome: Progressing Goal: Will not experience complications related to urinary  retention Outcome: Progressing   Problem: Pain Managment: Goal: General experience of comfort will improve and/or be controlled Outcome: Progressing   Problem: Safety: Goal: Ability to remain free from injury will improve Outcome: Progressing   Problem: Skin Integrity: Goal: Risk for impaired skin integrity will decrease Outcome: Progressing

## 2024-05-18 ENCOUNTER — Encounter (HOSPITAL_COMMUNITY): Payer: Self-pay | Admitting: Urology

## 2024-05-18 DIAGNOSIS — N401 Enlarged prostate with lower urinary tract symptoms: Secondary | ICD-10-CM | POA: Diagnosis not present

## 2024-05-18 LAB — BASIC METABOLIC PANEL WITH GFR
Anion gap: 13 (ref 5–15)
BUN: 15 mg/dL (ref 8–23)
CO2: 19 mmol/L — ABNORMAL LOW (ref 22–32)
Calcium: 8.8 mg/dL — ABNORMAL LOW (ref 8.9–10.3)
Chloride: 105 mmol/L (ref 98–111)
Creatinine, Ser: 0.9 mg/dL (ref 0.61–1.24)
GFR, Estimated: >60 mL/min (ref >60)
Glucose, Bld: 121 mg/dL — ABNORMAL HIGH (ref 70–99)
Potassium: 5.1 mmol/L (ref 3.5–5.1)
Sodium: 137 mmol/L (ref 135–145)

## 2024-05-18 LAB — HEMOGLOBIN AND HEMATOCRIT, BLOOD
HCT: 43.3 % (ref 39.0–52.0)
Hemoglobin: 14.4 g/dL (ref 13.0–17.0)

## 2024-05-18 NOTE — Progress Notes (Signed)
   05/18/24 1007  TOC Brief Assessment  Insurance and Status Reviewed  Patient has primary care physician Yes  Home environment has been reviewed Resides with spouse  Prior level of function: Independent with ADLs at baseline  Prior/Current Home Services No current home services  Social Drivers of Health Review SDOH reviewed no interventions necessary  Readmission risk has been reviewed Yes  Transition of care needs no transition of care needs at this time

## 2024-05-18 NOTE — Op Note (Signed)
 NAME: Billy Moss, Billy Moss MEDICAL RECORD NO: [id number] ACCOUNT NO: [id number] DATE OF BIRTH: [birth year] FACILITY: [facility] LOCATION: [id number] PHYSICIAN: Ricardo Likens, MD  Operative Report   DATE OF PROCEDURE: 05/17/2024  PREOPERATIVE DIAGNOSIS:  Massive prostatic hypertrophy with urinary retention.  PROCEDURE PERFORMED:  Robotic simple prostatectomy.  ESTIMATED BLOOD LOSS:  150 mL.  COMPLICATIONS:  None.  SPECIMENS:  Prostate adenoma for permanent pathology.  FINDINGS: 1.  Large asymmetric bilobar prostatic hypertrophy, right lobe greater than left. 2.  Wide open urinary channel from the bladder neck to the membranous urethra following simple prostatectomy.  DRAINS: 1.  Foley catheter to straight drain, irrigation port plugged. 2.  Jackson-Pratt drain to bulb suction.  ASSISTANT:  Alan Hammonds, PA  INDICATIONS:  The patient is a very pleasant and quite vigorous 83 year old man with a longstanding history of obstructive voiding.  He has been on 5-alpha reductase inhibitors for years.  He subsequently developed frank retention in 11/2023, that was  refractory to additional medication management.  Imaging did reveal a prostate that was greater than 200 g.  Options were discussed including chronic catheter versus outlet procedure with simple prostatectomy being most definitive with his gland size and  he wished to proceed with that.  He presents for this today.  Informed consent was obtained and placed in the medical record.  DESCRIPTION OF PROCEDURE:  The patient being verified, procedure being robotic simple prostatectomy was confirmed.  Procedure timeout was performed.  Intravenous antibiotics administered.  General endotracheal anesthesia was induced.  The patient was  placed into a low lithotomy position.  Sterile field was created, prepping and draping the patient's penis, perineum, and proximal thigh using iodine and his infraxiphoid abdomen using chlorhexidine  gluconate after his in  situ catheter had been removed.   He was further fastened to the operative table using 3-inch tape with foam padding across the supraxiphoid chest.  A test of steep Trendelenburg positioning was performed.  He was found to be suitably positioned.  Next, a high-flow, low-pressure  pneumoperitoneum was obtained using Veress technique in the supraumbilical midline, having passed the aspiration and drop test.  An 8-mm robotic camera port was then placed in the same location.  Laparoscopic examination of the peritoneal cavity revealed  just some loose adhesions of the sigmoid to the peritoneum but no significant adhesions or visceral injury.  No evidence of any active diverticulitis.  Additional ports were placed as follows:  Right paramedian 8-mm robotic port, right far lateral 12-mm  AirSeal assist port, left paramedian 8-mm robotic port, left far lateral 8-mm robotic port, and right paramedian 5-mm suction port.  The robot was docked and passed the electronic checks.  Initial attention was directed at limited adhesiolysis.  Loose  adhesions were taken down of the sigmoid and the left lateral peritoneum, which allowed significant straightening of the sigmoid and able to retract it out of the true pelvis, which provided more working space.  The space of Retzius was then developed by  developing the plane between the medial umbilical ligaments and dropping the bladder inferiorly.  Bilateral vas deferens were encountered and ligated during this process.  The endopelvic fascia was then swept away from the apical aspect of the prostate  just enough to visualize the dorsal venous complex that was controlled using green load stapler.  The bladder neck was identified by moving the Foley catheter back and forth.  This area was defatted some and an inverted U cystotomy was made approximately  2 cm proximal to  the true bladder neck for about 50% circumference of the bladder.  This provided excellent visualization of a  very large prostate adenoma, which was quite asymmetric with the right lobe being greater than the left.  On imaging, it was  assumed that the asymmetry was in median lobe, but it was actually just bilobar hypertrophy with the right lobe being much greater than the left.  Four adenoma anchoring sutures were applied of figure-of-eight Vicryl, two on the left and two on the right  to manipulate the adenoma and this was placed on anterior traction.  The trigonal ridge was visualized and well away from this.  The adenoma plane was entered posteriorly by incising approximately 2 cm distal to the trigonal ridge.  The posterior  adenoma plane was entered.  This was dissected towards the area of the apex as denoted by anterior curvature at the 12 o'clock position.  The adenoma plane was then developed from the 12 o'clock to the 9 o'clock position on the right side and then from  the 9 o'clock position to the 12 o'clock position on the right side, again all the way to the apex.  Finally, the left adenoma plane was developed similarly from the 6 o'clock to 12 o'clock position on the right side.  Final apical dissection was  performed by performing a butterfly-type incision along the area of the catheter at the anterior commissure of the adenoma.  This allowed much better visualization of the true apex, which was incised and completely freed up the large adenoma specimen and  placed in an EndoCatch bag for later retrieval.  The prostate fossa was then carefully fulgurated using point coagulation current, which revealed excellent hemostasis.  I was quite happy with the integrity.  There was no evidence of a capsular  perforation.  Next, a posterior mucosal bridge was formed using a double-armed 3-0 V-Loc suture, reapproximating approximately the 50% circumference posteriorly of the membranous urethra to the posterior aspect of the bladder mucosa, bringing the  structure in a tension-free apposition and creating an  excellent mucosal bridge across the posterior prostatic fossa, resulted in even further hemostasis.  The cystotomy was then closed using two separate running suture layers of 2-0 Stratafix beginning  at the 12 o'clock position and tied to each other.  A new 3-way Foley catheter was then placed with 30 mL of water  in the balloon, irrigated quantitatively.  Sponge and needle counts were correct.  We achieved the goal of extirpated portion of the  procedure today.  A closed suction drain was brought through the previous left lateral-most port site in the area of the peritoneal cavity.  The robot was then undocked.  The previous right lateral-most incision port site was closed at the level of  fascia using a Carter-Thomason suture passer with 0 Vicryl.  The specimen was retrieved by extending the previous camera port site superiorly and inferiorly for a total distance of approximately 4 cm, removing the large adenoma specimen, setting aside  for permanent pathology.  The fascia was reapproximated using figure-of-eight PDS x3, followed by reapproximation of Scarpa's with running Vicryl.  All incision sites were infiltrated with dilute lipolyzed Marcaine  and closed at the level of the skin  using subcuticular Monocryl followed by Dermabond.  Procedure then terminated.  The patient tolerated the procedure well.  No immediate periprocedural complications.  The patient was taken to postanesthesia care unit in stable condition.  Plan for  observation admission.  Please note first assistant, Alan Hammonds  was crucial for all portions of the surgery today.  She provided invaluable retraction, suctioning, vascular stapling, specimen manipulation, robotic instrument exchange, and general first assistance.  Ricardo Likens, MD   [facility] D: 05/17/2024 11:21:22 am T: 05/17/2024 12:37:00 pm  JOB: [id number]/ [id number]

## 2024-05-18 NOTE — Plan of Care (Signed)
   Problem: Education: Goal: Knowledge of the procedure and recovery process will improve Outcome: Progressing   Problem: Bowel/Gastric: Goal: Gastrointestinal status for postoperative course will improve Outcome: Progressing   Problem: Pain Management: Goal: General experience of comfort will improve Outcome: Progressing   Problem: Skin Integrity: Goal: Demonstration of wound healing without infection will improve Outcome: Progressing   Problem: Urinary Elimination: Goal: Ability to avoid or minimize complications of infection will improve Outcome: Progressing Goal: Ability to achieve and maintain urine output will improve Outcome: Progressing Goal: Home care management will improve Outcome: Progressing   Problem: Education: Goal: Knowledge of General Education information will improve Description: Including pain rating scale, medication(s)/side effects and non-pharmacologic comfort measures Outcome: Progressing   Problem: Health Behavior/Discharge Planning: Goal: Ability to manage health-related needs will improve Outcome: Progressing   Problem: Clinical Measurements: Goal: Ability to maintain clinical measurements within normal limits will improve Outcome: Progressing Goal: Will remain free from infection Outcome: Progressing Goal: Diagnostic test results will improve Outcome: Progressing Goal: Respiratory complications will improve Outcome: Progressing Goal: Cardiovascular complication will be avoided Outcome: Progressing   Problem: Activity: Goal: Risk for activity intolerance will decrease Outcome: Progressing   Problem: Nutrition: Goal: Adequate nutrition will be maintained Outcome: Progressing   Problem: Coping: Goal: Level of anxiety will decrease Outcome: Progressing   Problem: Elimination: Goal: Will not experience complications related to bowel motility Outcome: Progressing Goal: Will not experience complications related to urinary  retention Outcome: Progressing   Problem: Pain Managment: Goal: General experience of comfort will improve and/or be controlled Outcome: Progressing   Problem: Safety: Goal: Ability to remain free from injury will improve Outcome: Progressing   Problem: Skin Integrity: Goal: Risk for impaired skin integrity will decrease Outcome: Progressing

## 2024-05-18 NOTE — Plan of Care (Signed)
 Problem: Education: Goal: Knowledge of the procedure and recovery process will improve 05/18/2024 1001 by Terance Leonor BRAVO, RN Outcome: Adequate for Discharge 05/18/2024 0750 by Terance Leonor BRAVO, RN Outcome: Progressing   Problem: Bowel/Gastric: Goal: Gastrointestinal status for postoperative course will improve 05/18/2024 1001 by Terance Leonor BRAVO, RN Outcome: Adequate for Discharge 05/18/2024 0750 by Terance Leonor BRAVO, RN Outcome: Progressing   Problem: Pain Management: Goal: General experience of comfort will improve 05/18/2024 1001 by Terance Leonor BRAVO, RN Outcome: Adequate for Discharge 05/18/2024 0750 by Terance Leonor BRAVO, RN Outcome: Progressing   Problem: Skin Integrity: Goal: Demonstration of wound healing without infection will improve 05/18/2024 1001 by Terance Leonor BRAVO, RN Outcome: Adequate for Discharge 05/18/2024 0750 by Terance Leonor BRAVO, RN Outcome: Progressing   Problem: Urinary Elimination: Goal: Ability to avoid or minimize complications of infection will improve 05/18/2024 1001 by Terance Leonor BRAVO, RN Outcome: Adequate for Discharge 05/18/2024 0750 by Terance Leonor BRAVO, RN Outcome: Progressing Goal: Ability to achieve and maintain urine output will improve 05/18/2024 1001 by Terance Leonor BRAVO, RN Outcome: Adequate for Discharge 05/18/2024 0750 by Terance Leonor BRAVO, RN Outcome: Progressing Goal: Home care management will improve 05/18/2024 1001 by Terance Leonor BRAVO, RN Outcome: Adequate for Discharge 05/18/2024 0750 by Terance Leonor BRAVO, RN Outcome: Progressing   Problem: Education: Goal: Knowledge of General Education information will improve Description: Including pain rating scale, medication(s)/side effects and non-pharmacologic comfort measures 05/18/2024 1001 by Terance Leonor BRAVO, RN Outcome: Adequate for Discharge 05/18/2024 0750 by Terance Leonor BRAVO, RN Outcome: Progressing   Problem: Health Behavior/Discharge  Planning: Goal: Ability to manage health-related needs will improve 05/18/2024 1001 by Terance Leonor BRAVO, RN Outcome: Adequate for Discharge 05/18/2024 0750 by Terance Leonor BRAVO, RN Outcome: Progressing   Problem: Clinical Measurements: Goal: Ability to maintain clinical measurements within normal limits will improve 05/18/2024 1001 by Terance Leonor BRAVO, RN Outcome: Adequate for Discharge 05/18/2024 0750 by Terance Leonor BRAVO, RN Outcome: Progressing Goal: Will remain free from infection 05/18/2024 1001 by Terance Leonor BRAVO, RN Outcome: Adequate for Discharge 05/18/2024 0750 by Terance Leonor BRAVO, RN Outcome: Progressing Goal: Diagnostic test results will improve 05/18/2024 1001 by Terance Leonor BRAVO, RN Outcome: Adequate for Discharge 05/18/2024 0750 by Terance Leonor BRAVO, RN Outcome: Progressing Goal: Respiratory complications will improve 05/18/2024 1001 by Terance Leonor BRAVO, RN Outcome: Adequate for Discharge 05/18/2024 0750 by Terance Leonor BRAVO, RN Outcome: Progressing Goal: Cardiovascular complication will be avoided 05/18/2024 1001 by Terance Leonor BRAVO, RN Outcome: Adequate for Discharge 05/18/2024 0750 by Terance Leonor BRAVO, RN Outcome: Progressing   Problem: Activity: Goal: Risk for activity intolerance will decrease 05/18/2024 1001 by Terance Leonor BRAVO, RN Outcome: Adequate for Discharge 05/18/2024 0750 by Terance Leonor BRAVO, RN Outcome: Progressing   Problem: Nutrition: Goal: Adequate nutrition will be maintained 05/18/2024 1001 by Terance Leonor BRAVO, RN Outcome: Adequate for Discharge 05/18/2024 0750 by Terance Leonor BRAVO, RN Outcome: Progressing   Problem: Coping: Goal: Level of anxiety will decrease 05/18/2024 1001 by Terance Leonor BRAVO, RN Outcome: Adequate for Discharge 05/18/2024 0750 by Terance Leonor BRAVO, RN Outcome: Progressing   Problem: Elimination: Goal: Will not experience complications related to bowel motility 05/18/2024 1001  by Terance Leonor BRAVO, RN Outcome: Adequate for Discharge 05/18/2024 0750 by Terance Leonor BRAVO, RN Outcome: Progressing Goal: Will not experience complications related to urinary retention 05/18/2024 1001 by Terance Leonor BRAVO, RN Outcome: Adequate for Discharge 05/18/2024 0750 by Terance Leonor BRAVO, RN Outcome: Progressing   Problem: Pain Managment: Goal: General  experience of comfort will improve and/or be controlled 05/18/2024 1001 by Terance Leonor BRAVO, RN Outcome: Adequate for Discharge 05/18/2024 0750 by Terance Leonor BRAVO, RN Outcome: Progressing   Problem: Safety: Goal: Ability to remain free from injury will improve 05/18/2024 1001 by Terance Leonor BRAVO, RN Outcome: Adequate for Discharge 05/18/2024 0750 by Terance Leonor BRAVO, RN Outcome: Progressing   Problem: Skin Integrity: Goal: Risk for impaired skin integrity will decrease 05/18/2024 1001 by Terance Leonor BRAVO, RN Outcome: Adequate for Discharge 05/18/2024 0750 by Terance Leonor BRAVO, RN Outcome: Progressing

## 2024-05-18 NOTE — Care Management Obs Status (Signed)
 MEDICARE OBSERVATION STATUS NOTIFICATION   Patient Details  Name: Billy Moss MRN: [id number] Date of Birth: 1940-09-06   Medicare Observation Status Notification Given:  Yes    Duwaine GORMAN Aran, LCSW 05/18/2024, 11:11 AM

## 2024-05-18 NOTE — Discharge Summary (Signed)
 Date of admission: 05/17/2024  Date of discharge: 05/18/2024  Admission diagnosis: BPH  Discharge diagnosis: BPH   History and Physical: For full details, please see admission history and physical. Briefly, Billy Moss is a 83 y.o. gentleman with enlarged prostate.  After discussing management/treatment options, he elected to proceed with surgical treatment.  Hospital Course: Billy Moss was taken to the operating room on 05/17/2024 and underwent a robotic assisted laparoscopic simple prostatectomy. He tolerated this procedure well and without complications.   Postoperatively, he was able to be transferred to a regular hospital room following recovery from anesthesia.  He was able to begin ambulating the night of surgery. He remained hemodynamically stable overnight.  He had excellent urine output with appropriately minimal output from his pelvic drain and his pelvic drain was removed on POD #1.  He was transitioned to oral pain medication, tolerated a diet, and had met all discharge criteria and was able to be discharged home later on POD#1.  Laboratory values:  Recent Labs    05/17/24 1158 05/18/24 0424  HGB 13.6 14.4  HCT 42.7 43.3    Disposition: Home  Discharge instruction: He was instructed to be ambulatory but to refrain from heavy lifting, strenuous activity, or driving. He was instructed on urethral catheter care.  Discharge medications:   Allergies as of 05/18/2024       Reactions   Sudafed [pseudoephedrine Hcl] Other (See Comments)   Syncope    Sulfa Antibiotics Rash        Medication List     STOP taking these medications    ascorbic acid 500 MG tablet Commonly known as: VITAMIN C   cyanocobalamin 1000 MCG tablet Commonly known as: VITAMIN B12   ibuprofen 200 MG tablet Commonly known as: ADVIL   Mens 50+ Multi Vitamin/Min Tabs   VITAMIN D-3 PO       TAKE these medications    ciprofloxacin  500 MG tablet Commonly known as: Cipro  Take 1  tablet (500 mg total) by mouth 2 (two) times daily. Start day prior to office visit for foley removal   docusate sodium  100 MG capsule Commonly known as: COLACE Take 1 capsule (100 mg total) by mouth 2 (two) times daily.   dutasteride  0.5 MG capsule Commonly known as: AVODART  Take 0.5 mg by mouth at bedtime.   HYDROcodone -acetaminophen  5-325 MG tablet Commonly known as: NORCO/VICODIN Take 1-2 tablets by mouth every 6 (six) hours as needed for moderate pain (pain score 4-6) or severe pain (pain score 7-10).   metoprolol  succinate 25 MG 24 hr tablet Commonly known as: TOPROL -XL Take 12.5 mg by mouth daily.   simvastatin  20 MG tablet Commonly known as: ZOCOR  Take 20 mg by mouth See admin instructions. Take 1 tablet (20mg ) by mouth on Monday, Wednesday and Friday night.        Followup: He will followup in 1 week for catheter removal and to discuss his surgical pathology results.

## 2024-05-23 LAB — SURGICAL PATHOLOGY

## 2024-09-18 ENCOUNTER — Other Ambulatory Visit: Payer: Self-pay | Admitting: Internal Medicine

## 2024-09-18 DIAGNOSIS — J432 Centrilobular emphysema: Secondary | ICD-10-CM

## 2024-09-18 DIAGNOSIS — R0609 Other forms of dyspnea: Secondary | ICD-10-CM

## 2024-09-20 ENCOUNTER — Ambulatory Visit
Admission: RE | Admit: 2024-09-20 | Discharge: 2024-09-20 | Disposition: A | Discharge status: Home / Self Care | Source: Ambulatory Visit | Attending: Internal Medicine | Admitting: Internal Medicine

## 2024-09-20 DIAGNOSIS — J432 Centrilobular emphysema: Secondary | ICD-10-CM | POA: Insufficient documentation

## 2024-09-20 DIAGNOSIS — R0609 Other forms of dyspnea: Secondary | ICD-10-CM | POA: Diagnosis present

## 2024-09-20 MED ORDER — IOHEXOL 350 MG/ML SOLN
75.0000 mL | Freq: Once | INTRAVENOUS | Status: AC | PRN
  Administered 2024-09-20: 75 mL via INTRAVENOUS
